# Patient Record
Sex: Male | Born: 1950
Health system: Southern US, Community
[De-identification: ages and names within clinical notes are randomized; demographics above are authoritative.]

## PROBLEM LIST (undated history)

## (undated) DIAGNOSIS — M25519 Pain in unspecified shoulder: Secondary | ICD-10-CM

## (undated) DIAGNOSIS — J449 Chronic obstructive pulmonary disease, unspecified: Secondary | ICD-10-CM

## (undated) DIAGNOSIS — I10 Essential (primary) hypertension: Secondary | ICD-10-CM

## (undated) DIAGNOSIS — E785 Hyperlipidemia, unspecified: Secondary | ICD-10-CM

## (undated) DIAGNOSIS — G8929 Other chronic pain: Secondary | ICD-10-CM

---

## 2003-08-18 ENCOUNTER — Ambulatory Visit (HOSPITAL_COMMUNITY): Admission: RE | Admit: 2003-08-18 | Discharge: 2003-08-18 | Payer: Self-pay | Admitting: Family Medicine

## 2003-11-02 ENCOUNTER — Ambulatory Visit (HOSPITAL_COMMUNITY): Admission: RE | Admit: 2003-11-02 | Discharge: 2003-11-02 | Payer: Self-pay | Admitting: Internal Medicine

## 2003-11-24 ENCOUNTER — Emergency Department (HOSPITAL_COMMUNITY): Admission: EM | Admit: 2003-11-24 | Discharge: 2003-11-24 | Payer: Self-pay | Admitting: *Deleted

## 2004-08-21 ENCOUNTER — Inpatient Hospital Stay (HOSPITAL_COMMUNITY): Admission: EM | Admit: 2004-08-21 | Discharge: 2004-08-23 | Payer: Self-pay | Admitting: Emergency Medicine

## 2004-08-22 ENCOUNTER — Ambulatory Visit: Payer: Self-pay | Admitting: *Deleted

## 2004-08-23 ENCOUNTER — Ambulatory Visit: Payer: Self-pay | Admitting: Cardiology

## 2005-10-22 ENCOUNTER — Emergency Department (HOSPITAL_COMMUNITY): Admission: EM | Admit: 2005-10-22 | Discharge: 2005-10-22 | Payer: Self-pay | Admitting: Emergency Medicine

## 2006-09-18 ENCOUNTER — Emergency Department (HOSPITAL_COMMUNITY): Admission: EM | Admit: 2006-09-18 | Discharge: 2006-09-19 | Payer: Self-pay | Admitting: Emergency Medicine

## 2006-09-21 ENCOUNTER — Ambulatory Visit (HOSPITAL_COMMUNITY): Admission: RE | Admit: 2006-09-21 | Discharge: 2006-09-21 | Payer: Self-pay | Admitting: Family Medicine

## 2006-09-24 ENCOUNTER — Ambulatory Visit (HOSPITAL_COMMUNITY): Admission: RE | Admit: 2006-09-24 | Discharge: 2006-09-24 | Payer: Self-pay | Admitting: Family Medicine

## 2008-11-12 ENCOUNTER — Ambulatory Visit: Payer: Self-pay | Admitting: Internal Medicine

## 2008-12-10 ENCOUNTER — Ambulatory Visit: Payer: Self-pay | Admitting: Internal Medicine

## 2008-12-10 ENCOUNTER — Encounter: Payer: Self-pay | Admitting: Internal Medicine

## 2008-12-10 ENCOUNTER — Ambulatory Visit (HOSPITAL_COMMUNITY): Admission: RE | Admit: 2008-12-10 | Discharge: 2008-12-10 | Payer: Self-pay | Admitting: Internal Medicine

## 2008-12-15 ENCOUNTER — Encounter: Payer: Self-pay | Admitting: Internal Medicine

## 2011-01-31 NOTE — Op Note (Signed)
NAMELANEY, BAGSHAW               ACCOUNT NO.:  [id number]   MEDICAL RECORD NO.:  [id number]          PATIENT TYPE:  AMB   LOCATION:  DAY                           FACILITY:  [facility]   PHYSICIAN:  R. Roetta Sessions, M.D. DATE OF BIRTH:  03/03/1951   DATE OF PROCEDURE:  12/10/2008  DATE OF DISCHARGE:                               OPERATIVE REPORT   INDICATIONS FOR PROCEDURE:  A 60 year old gentleman with a history of  colonic adenomas.  Last colonoscopy was 5 years ago.  He has no lower GI  tract symptoms.  Colonoscopy is now being done as surveillance maneuver.  Risks, benefits, alternatives and limitations have been reviewed,  questions answered.  Please see the documentation in the medical record.   PROCEDURE NOTE:  O2 saturation, blood pressure, pulse and respirations  monitored throughout the entire procedure.  Conscious sedation Versed 4  mg IV, Demerol 75 mg IV in divided doses.   INSTRUMENT:  Pentax video chip system.   FINDINGS:  Digital rectal exam revealed no abnormalities.   ENDOSCOPIC FINDINGS:  The prep was good.   Colon:  Colonic mucosa was surveyed from the rectosigmoid junction  through the left, transverse, right colon to the area of the appendiceal  orifice, ileocecal valve and cecum.  These structures were well seen and  photographed for the record.  From this level, the scope was slowly and  cautiously withdrawn.  All previously mentioned mucosal surfaces were  again seen.  The colonic mucosa appeared normal aside from a single  diminutive polyp at the hepatic flexure which was cold biopsied/removed.  Remainder of colonic mucosa appeared normal.  Scope was pulled down to  the rectum where thorough examination of the rectal mucosa including  retroflexion of the  anal verge demonstrated no abnormalities.  The  patient tolerated the procedure well and was reactive to endoscopy.  Cecal withdrawal time 8 minutes.   IMPRESSION:  1. Normal rectum.  2. Diminutive  hepatic flexure polyp, otherwise normal colon.   RECOMMENDATIONS:  1. Follow-up on pathology.  2. Further recommendations to follow.      Jonathon Bellows, M.D.  Electronically Signed     RMR/MEDQ  D:  12/10/2008  T:  12/10/2008  Job:  [id number]   cc:   Angus G. Renard Matter, MD  Fax: [phone]

## 2011-02-03 NOTE — Op Note (Signed)
NAME:  Greg Palmer, Greg Palmer                         ACCOUNT NO.:  [id number]   MEDICAL RECORD NO.:  [id number]                   PATIENT TYPE:  AMB   LOCATION:  DAY                                  FACILITY:  [facility]   PHYSICIAN:  R. Roetta Sessions, M.D.              DATE OF BIRTH:  [birth year]   DATE OF PROCEDURE:  11/02/2003  DATE OF DISCHARGE:                                 OPERATIVE REPORT   INDICATIONS FOR PROCEDURE:  The patient is a 60 year old gentleman who comes  for colorectal cancer screening.  He is followed primarily by Dr. Renard Matter.  He is devoid of any lower GI tract symptoms.  There is no family history of  colorectal neoplasia.  He has never had a colonoscopy.  Colonoscopy is now  being done as a standard screening maneuver.  This approach has been  discussed with the patient, along with the potential risks, benefits, and  alternatives.  Questions were answered.  He is agreeable.  Please see the  documentation in the medical record.   PROCEDURE NOTE:  O2 saturation, blood pressure, pulse, and respirations were  monitored throughout the entire procedure.   CONSCIOUS SEDATION:  Versed 3 mg IV, Demerol 75 mg IV in divided doses.   INSTRUMENT:  Olympus video chip adult colonoscope.   FINDINGS:  Digital rectal examination revealed no abnormalities.   ENDOSCOPIC FINDINGS:  Prep was adequate.   RECTUM:  Examination of the rectal mucosa including retroflexed view of the  anal verge revealed no abnormalities.   COLON:  The colonic mucosa was surveyed from the rectosigmoid junction  through the left transverse, right colon, in the area of the appendiceal  orifice, ileocecal valve, and cecum.  These structures were well-seen and  photographed for the record.  From this level, the scope was slowly  withdrawn.  All previously mentioned mucosal surfaces were again seen.  The  terminal ileum was intubated to 10 cm.  The terminal ileum and the colonic  mucosa appeared entirely normal  except for a 5 mm polyp on a stalk in the  middescending colon.  It was cold snared and recovered through the scope.  The patient tolerated the procedure well and was reacted in endoscopy.   IMPRESSION:  Normal rectum.  Small polyp on a stalk, middescending colon,  cold snared and removed.  The remainder of the colonic mucosa and terminal  ileum appeared normal.   RECOMMENDATIONS:  1. Followup on pathology.  2. Further recommendations to follow.      ___________________________________________                                            Jonathon Bellows, M.D.   RMR/MEDQ  D:  11/02/2003  T:  11/02/2003  Job:  [id number]   cc:   Angus  Edilia Bo, M.D.  [address]  [place]  [place] [address]  Fax: [phone]

## 2011-02-03 NOTE — Group Therapy Note (Signed)
NAMETAEVIN, MCFERRAN               ACCOUNT NO.:  [id number]   MEDICAL RECORD NO.:  [id number]          PATIENT TYPE:  INP   LOCATION:  A203                          FACILITY:  [facility]   PHYSICIAN:  Angus G. Renard Matter, MD   DATE OF BIRTH:  [birth year]   DATE OF PROCEDURE:  DATE OF DISCHARGE:                                   PROGRESS NOTE   This patient was admitted to the hospital with anterior chest pain to rule  out a coronary syndrome.  This was felt to be atypical chest pain by a  cardiologist.  His cardiac enzymes remain normal.  He is scheduled for a  Cardiolite study.  Echocardiogram has been completed.   OBJECTIVE:  VITAL SIGNS:  Blood pressure 133/86, respirations 20, pulse 89,  temp 97.8.  LUNGS:  Clear to palpation and auscultation.  HEART:  Regular rhythm.   ASSESSMENT:  Patient was admitted with anterior chest pain of undetermined  etiology to rule out coronary syndrome.   PLAN:  To proceed with Cardiolite stress test.     Angu   AGM/MEDQ  D:  08/23/2004  T:  08/23/2004  Job:  [id number]

## 2011-02-03 NOTE — Discharge Summary (Signed)
NAMEMATIN, MATTIOLI               ACCOUNT NO.:  [id number]   MEDICAL RECORD NO.:  [id number]          PATIENT TYPE:  INP   LOCATION:  A203                          FACILITY:  [facility]   PHYSICIAN:  Angus G. Renard Matter, MD   DATE OF BIRTH:  14-Sep-1951   DATE OF ADMISSION:  08/21/2004  DATE OF DISCHARGE:  12/06/2005LH                                 DISCHARGE SUMMARY   DATES:  A 60 year old white Palmer admitted August 21, 2004, discharged  August 23, 2004, two days hospitalization.   DIAGNOSES:  1.  Respiratory infection.  2.  Bronchitis.  3.  Pleurisy.  4.  Dyslipidemia.  5.  Chest pain, anterior.   CONDITION:  Stable and improved at the time of discharge.   HISTORY OF PRESENT ILLNESS:  This Greg Palmer presented to the  emergency department with the chief complaint being intermittent anterior  chest pain which began on the day of admission.  He apparently had an  episode of vomiting as well, and experienced tightness over the anterior  chest with shortness of breath.  He was seen and evaluated by the emergency  department physician at [facility].  An electrocardiogram  was interpreted as possible old anterior septal infarct, rate of 82.   PHYSICAL EXAMINATION:  An alert Palmer.  Vital signs:  Blood pressure 112/63,  respirations 20, pulse 79.  HEENT:  Eyes, PERRLA, TM's negative.  Oropharynx  benign.  Neck supple.  No JVD or thyroid abnormalities.  Lungs clear to P&A.  Heart, regular rhythm, no murmurs, no cardiomegaly. Abdomen, no palpable  organs or masses.  Extremities free of edema.  Neurologic, no focal  deficits.   LABORATORY DATA:  Admission CBC:  WBC 7500, hemoglobin 14.1, hematocrit  40.3.  D-Dimer 0.24.  Chemistries, sodium 135, potassium 3.4, chloride 101.  CO2 28.  Glucose 131.  BUN 14, creatinine 1.  Calcium 8.3.  Total protein  6.6.  Albumin 3.6.  Liver enzymes:  SGOT 24, SGPT 32.  Alkaline phosphatase  72.  Bilirubin 0.6.  CPK on  admission, 170.  CPK-MB 1.9.  Relative index  1.1.  Troponin less than 0.01.  Subsequent CPK 132, 125.  CPK-MB 1.4, 101.  Relative index 1.1 and 0.9.  Troponin less than 0.01.  Lipid profile,  cholesterol 242, triglycerides 450.  HDL 29.  Cardiac markers, myoglobin  92.9, 92.8, 90.8.   Chest x-ray, no acute findings.  Echocardiogram normal ventricular chamber  size, mild hypertrophy with disproportionate septal thickening.  Electrocardiogram:  Questionable old anteroseptal infarct.   HOSPITAL COURSE:  The patient at the time of his admission, was placed on  half normal saline at [facility] rate, low-cholesterol diet, vital signs q.i.d.,  bed rest.  The patient had CPK, CPK-MB, troponin q.8h. x3.  He was given  nitroglycerin 0.4 mg sublingual p.r.n. for chest pain, aspirin 325 mg daily.  He was given Hycodan syrup one teaspoon q.4h. p.r.n. for cough.  He was seen  in cardiology.  He was made n.p.o. for exercise stress test.  Echocardiogram  for left ventricular ejection fraction.  Echocardiogram showed  mild  ventricular hypertrophy.  Cardiolite stress test results not on record at  the time of this dictation.  It was felt that the patient had atypical chest  pain, but no evidence of coronary disease, although it was considered at the  time of admission.   FOLLOWUP:  The patient improved during his hospital stay, had minimal chest  pain and was able to be discharged after two days hospitalization to be  followed as an outpatient, to be seen again by cardiology as well.     Angu   AGM/MEDQ  D:  09/06/2004  T:  09/06/2004  Job:  [id number]

## 2011-02-03 NOTE — Procedures (Signed)
[name], Greg Palmer               ACCOUNT NO.:  [id number]   MEDICAL RECORD NO.:  [id number]          PATIENT TYPE:  INP   LOCATION:  A203                          FACILITY:  [facility]   PHYSICIAN:  Lake Roberts Bing, M.D.  DATE OF BIRTH:  [birth year]   DATE OF PROCEDURE:  08/22/2004  DATE OF DISCHARGE:                                  ECHOCARDIOGRAM   REFERRING PHYSICIANS:  1.  Dr. Renard Matter.  2.  Dr. Dorethea Clan.   CLINICAL DATA:  This is a 60 year old gentleman with chest pain and dyspnea.   M-MODE:  1.  Aorta 3.2.  2.  Left atrium 3.7.  3.  Septum 1.7.  4.  Posterior wall 1.5.  5.  LV diastole 4.1.  6.  LV systole 3.5.   1.  Technically adequate echocardiographic study.  2.  Normal left atrium, right atrium and right ventricle.  3.  Mild aortic valvular sclerosis with normal function.  4.  Normal mitral valve; mild annular calcification; trivial regurgitation.  5.  Normal tricuspid and pulmonic valve; normal proximal pulmonary artery.  6.  Normal left ventricular chamber size; mild hypertrophy with      disproportionate septal thickening.  Normal regional and global LV      systolic function.  7.  Normal IVC.     Robe   RR/MEDQ  D:  08/22/2004  T:  08/23/2004  Job:  [id number]

## 2011-02-03 NOTE — H&P (Signed)
Greg Palmer, Greg Palmer               ACCOUNT NO.:  [id number]   MEDICAL RECORD NO.:  [id number]          PATIENT TYPE:  AMB   LOCATION:  DAY                           FACILITY:  [facility]   PHYSICIAN:  R. Roetta Sessions, M.D. DATE OF BIRTH:  06/07/1951   DATE OF ADMISSION:  11/12/2008  DATE OF DISCHARGE:  LH                              HISTORY & PHYSICAL   PRIMARY CARE PHYSICIAN:  Angus G. Renard Matter, MD.   CHIEF COMPLAINT:  Due for surveillance colonoscopy.   HISTORY OF PRESENT ILLNESS:  Greg Palmer is a 60 year old Caucasian male.  He presents today for a 5 year follow up colonoscopy.  He was found to  have an inflamed focally adenomatous polyp removed from his mid  descending colon, he had an otherwise normal exam.  He denies any GI  problems at this time.  Specifically, he denies any abdominal pain,  rectal bleeding, melena, change in bowel habits or weight loss.  His  appetite has been good.  He denies any fatigue.   PAST MEDICAL/SURGICAL HISTORY:  Last colonoscopy November 02, 2003 by  Dr. Jena Gauss.  Small inflamed focally adenomatous polyp removed from his  mid ascending colon.   CURRENT MEDICATIONS:  1. Multivitamin daily.  2. Vitamin C daily.  3. Flax seed daily.  4. Fish oil daily.  5. Burn Fat over-the-counter daily.   ALLERGIES:  NO KNOWN DRUG ALLERGIES.   FAMILY HISTORY:  There is no known family history of colon carcinoma,  liver or chronic GI problems.  Mother deceased at 33 secondary to  suicide.  Father deceased at 68 secondary to motor vehicle accident.  He  has one healthy brother.   SOCIAL HISTORY:  Greg Palmer is married.  He has 2 children.  He is  retired from [employer].  He has a remote history of tobacco abuse.  He consumes a couple of alcoholic beverages per year.  He has a past  history of marijuana use.  No current drug use.   REVIEW OF SYSTEMS:  See HPI, otherwise negative.   PHYSICAL EXAMINATION:  VITAL SIGNS:  Weight 262 pounds, height 71  inches,  temperature 98.4, blood pressure 132/88 and pulse 60.  GENERAL:  He is an obese Caucasian male who is alert, oriented,  pleasant, cooperative in no acute distress.  HEENT:  Sclerae are clear.  Conjunctivae are clear.  Oropharynx is pink  and moist without any lesions.  NECK:  Supple without mass, thyromegaly.  HEART:  Regular rate and rhythm.  Normal S1-S2 without any murmurs,  clicks, rubs or gallops.  LUNGS:  Clear to auscultation bilaterally.  ABDOMEN:  Protuberant with positive bowel sounds x4.  No bruits  auscultated.  Soft, nontender and nondistended without palpable mass,  hepatosplenomegaly, rebound or guarding.  EXTREMITIES:  Without clubbing or edema.   IMPRESSION:  Greg Palmer is a 60 year old Caucasian male with history of  adenomatous colon polyp.  He is here to setup surveillance colonoscopy.  He denies any GI problems at this time.   PLAN:  1. He will undergo surveillance colonoscopy with Dr. Jena Gauss in the  near      future.  I have discussed the procedure including risks and      benefits which include but are not limited to GI bleeding,      infection, perforation, drug reaction, increased pain and consent      will be obtained.  2. Pending this colonoscopy, he may be able to fall into a 10 year      surveillance colonoscopy mode if no polyps are found on this exam      given the new guidelines.      Lorenza Burton, N.P.      Greg Palmer, M.D.  Electronically Signed    KJ/MEDQ  D:  11/13/2008  T:  11/13/2008  Job:  [id number]   cc:   Angus G. Renard Matter, MD  Fax: [phone]

## 2011-02-03 NOTE — H&P (Signed)
Greg Palmer, Greg Palmer               ACCOUNT NO.:  [id number]   MEDICAL RECORD NO.:  [id number]          PATIENT TYPE:  INP   LOCATION:  A203                          FACILITY:  [facility]   PHYSICIAN:  Angus G. Renard Matter, MD   DATE OF BIRTH:  06/11/1951   DATE OF ADMISSION:  08/21/2004  DATE OF DISCHARGE:  LH                                HISTORY & PHYSICAL   This 60 year old white male presented himself to the ED with chief complaint  being intermittent anterior chest pain which began yesterday.  He apparently  had an episode of vomiting as well and has experienced tightness over the  anterior chest with some shortness of breath.  He was seen and evaluated by  ED physician at [facility].  Electrocardiogram was  interpreted as possible old anteroseptal infarct with a rate of 82.   LABORATORY AND X-RAY DATA:  CBC:  WBC 7500, hemoglobin 14.1, hematocrit  40.3.  Chemistries:  Sodium 135, potassium 3.4, chloride 101, CO2 28,  glucose 131, BUN 14, creatinine 1, calcium 8.3.  Total protein 6.6, albumin  3.6.  Cardiac markers:  Myoglobin 92.9, CPK-MB 2, troponin less than 0.05.  Subsequent cardiac markers:  Myoglobin 92.8, CPK-MB 2.1, troponin less than  0.05.   The patient was admitted for further evaluation.   SOCIAL HISTORY:  The patient does not smoke or drink alcohol, works at  [employer].   FAMILY HISTORY:  See previous record.   PAST MEDICAL AND SURGICAL HISTORY:  1.  History of previous pneumonia.  2.  History of asthma.  3.  Hyperlipidemia.   ALLERGIES:  None.   MEDICATIONS:  The patient takes no medications.   PHYSICAL EXAMINATION:  GENERAL:  Alert male.  VITAL SIGNS:  Blood pressure 112/63, respirations 20, pulse 79.  HEENT:  Eyes:  PERRLA.  TMs negative.  Oropharynx benign.  NECK:  Supple.  No JVD or thyroid abnormalities.  LUNGS:  Clear to percussion and auscultation.  HEART:  Regular rate and rhythm.  No murmurs, no cardiomegaly.  ABDOMEN:  No  palpable organs or masses.  SKIN:  Warm and dry.  EXTREMITIES:  Free of edema.  NEUROLOGIC:  No focal deficits.   DIAGNOSES:  1.  Anterior chest pain.  2.  Bronchitis.  3.  Rule out underlying coronary artery disease.  4.  Hyperlipidemia.     Angu   AGM/MEDQ  D:  08/21/2004  T:  08/21/2004  Job:  [id number]

## 2011-02-03 NOTE — Group Therapy Note (Signed)
Greg Palmer, Greg Palmer               ACCOUNT NO.:  [id number]   MEDICAL RECORD NO.:  [id number]          PATIENT TYPE:  INP   LOCATION:  A203                          FACILITY:  [facility]   PHYSICIAN:  Angus G. Renard Matter, MD   DATE OF BIRTH:  [birth year]   DATE OF PROCEDURE:  DATE OF DISCHARGE:                                   PROGRESS NOTE   This patient was admitted through the ED with anterior chest pain.  He has  had cough, nausea, pain with deep breathing.  His cardiac enzymes on  admission were normal.   OBJECTIVE:  VITAL SIGNS:  Blood pressure 104/81, respirations 20, pulse 97,  temp 96.3.  LUNGS:  Occasional rhonchus over lower lung field.  HEART:  Regular rate and rhythm.  ABDOMEN:  No palpable organs or masses.   Patient's cardiac enzymes remained normal.  CPK 132, CPK MB 1.4, relative  index 1.1, troponin less than 0.01.   ASSESSMENT:  Patient was admitted to the hospital with anterior chest pain  to rule out acute coronary syndrome.  He has had a cough and chest  congestion and pain with cough.  Does have upper respiratory infection,  bronchitis, possible pleurisy.   PLAN:  To continue current regimen.  Will proceed with a cardiology  evaluation.     Angu   AGM/MEDQ  D:  08/22/2004  T:  08/22/2004  Job:  [id number]

## 2011-02-03 NOTE — Consult Note (Signed)
NAMELYON, DUMONT               ACCOUNT NO.:  [id number]   MEDICAL RECORD NO.:  [id number]          PATIENT TYPE:  INP   LOCATION:  A203                          FACILITY:  [facility]   PHYSICIAN:  Vida Roller, M.D.   DATE OF BIRTH:  21-Oct-1950   DATE OF CONSULTATION:  08/22/2004  DATE OF DISCHARGE:                                   CONSULTATION   PRIMARY CARE PHYSICIAN:  Angus G. Renard Matter, M.D.   HISTORY OF PRESENT ILLNESS:  Mr. Brookens is a 60 year old gentleman with past  medical history significant for dyslipidemia who reports onset of anterior  chest pressure starting yesterday morning after coughing and vomiting spell.  He reports a constant anterior chest pressure which worsened with deep  inspiration. He also reports associated shortness of breath. This episode  occurred after he had been hunting yesterday morning. He denies any previous  episodes of chest discomfort. He has had a productive cough for the last  several weeks, however. He came to the emergency department for further  evaluation. There, he was given 1 sublingual nitroglycerin which resolved  his chest pressure; however, he still complains of pain with deep  inspiration. His breathing is improved.   PAST MEDICAL HISTORY:  1.  History of pneumonia in 2001.  2.  Asthma as a child.  3.  Dyslipidemia 1 year ago diagnosed with no medical therapy initiated.  4.  Colonoscopy in February of 2005 status post polypectomy.  5.  Stress test approximately five years ago that is negative per the      patient's report.   ALLERGIES:  No known drug allergies.   MEDICATIONS PRIOR TO ADMISSION:  Multivitamin daily.   Medications in the hospital:  1.  Enteric-coated aspirin 325 mg daily.  2.  IV half normal saline at [facility].  3.  Nitroglycerin as needed.  4.  Hydrocodone syrup as needed.  5.  Phenergan as needed.  6.  Ambien as needed.  7.  Flu vaccine and pneumonia vaccine x1.   SOCIAL HISTORY:  Mr. Harmon is married. He  lives in [place] with his wife.  He works as a Estate agent at [employer]. He has 8 children and 7  grandchildren. He has a history of tobacco abuse; however, he quit 30 years  ago. He does drink about a beer per day. No illicit drug use. Exercise:  He  is active with his hobbies including hunting and fishing. He does not follow  a low fat, low cholesterol diet.   FAMILY HISTORY:  Mother deceased at 47 years old secondary to suicide.  Father deceased at 32 years old with history of heart trouble, CVA, and  diabetes. He has one brother who is living with no known coronary artery  disease.   REVIEW OF SYSTEMS:  Negative for fevers or chills. Negative for congestion  or headaches. Negative for rashes or lesions. CARDIOVASCULAR:  Per HPI. He  does state that he has had a syncopal episode after coughing where he was  out for about a second. GENITOURINARY:  No frequency or dysuria. NEUROPSYCH:  No weakness or  numbness. MUSCULOSKELETAL:  No myalgias or arthralgias.  GASTROINTESTINAL:  Nausea and vomiting x1 episode. No diarrhea,  constipation, bright red blood per rectum, melena, abdominal pain, or GERD  symptoms. All other systems reviewed are negative.   PHYSICAL EXAMINATION:  VITAL SIGNS:  Temperature 96.3, pulse 97,  respirations 20, blood pressure 104/81. Weight 235 pounds.  GENERAL:  This is a well-developed, well-nourished male in no acute  distress.  HEENT:  Normocephalic, atraumatic. Pupils are equal, round, and reactive to  light.  NECK:  Reveals no jugular venous distention and no carotid bruits.  CARDIOVASCULAR:  S1 and S2 normal with no murmurs, rubs, or gallops  appreciated.  LUNGS:  Clear to auscultation bilaterally without wheezes, rales, or  rhonchi.  SKIN:  Warm and dry with no rashes or lesions noted.  ABDOMEN:  Soft, nontender, with active bowel sounds.  GENITOURINARY/RECTAL:  Deferred.  EXTREMITIES:  Revealed no clubbing, cyanosis, or edema, and distal pulses   are intact and equal in all 4 extremities.  MUSCULOSKELETAL:  No joint deformity or effusions are noted.  NEUROLOGICAL:  He is alert and oriented x3. Grossly nonfocal exam.   STUDIES:  Chest x-ray:  No acute findings. Electrocardiogram:  Sinus rhythm  at 82 beats per minute with left axis deviation, nonspecific ST  abnormalities, normal PR interval, QRS duration and QTC.   LABORATORY DATA:  White blood cell count 7.5, hemoglobin 14.1, hematocrit  40.3, platelets 217. Sodium 135, potassium 3.4, chloride 101, bicarb 28, BUN  14, creatinine 1.0, glucose 131. Total bilirubin 0.6, alkaline phosphatase  72, AST 24, ALT 32, total protein 6.6, albumin 3.6, calcium 8.3. Cardiac  enzymes were negative x2 for acute myocardial infarction.   IMPRESSION AND PLAN:  1.  Atypical chest discomfort in a patient with cardiac risk factors      including male sex and dyslipidemia. He has had 2 sets of cardiac      enzymes that are negative for acute myocardial infarction, and he has no      ischemic changes noted on EKG. For further evaluation, will check a D-      dimer. If this is elevated, would check a chest CT for rule out      pulmonary embolism. Will also obtain an echocardiogram today and an      exercise Cardiolite tomorrow if his pulmonary embolism workup is      negative. Will obtain TSH level.  2.  Dyslipidemia. Will check fasting lipid profile and treat according to      the results.   We appreciate this consult and will be happy to follow this patient along  with you.     Amy   AB/MEDQ  D:  08/22/2004  T:  08/22/2004  Job:  [id number]

## 2011-08-08 ENCOUNTER — Encounter: Payer: Self-pay | Admitting: Emergency Medicine

## 2011-08-08 ENCOUNTER — Emergency Department (HOSPITAL_COMMUNITY)
Admission: EM | Admit: 2011-08-08 | Discharge: 2011-08-08 | Disposition: A | Discharge status: Home / Self Care | Payer: BC Managed Care – PPO | Attending: Emergency Medicine | Admitting: Emergency Medicine

## 2011-08-08 DIAGNOSIS — W57XXXA Bitten or stung by nonvenomous insect and other nonvenomous arthropods, initial encounter: Secondary | ICD-10-CM | POA: Insufficient documentation

## 2011-08-08 DIAGNOSIS — S30860A Insect bite (nonvenomous) of lower back and pelvis, initial encounter: Secondary | ICD-10-CM | POA: Insufficient documentation

## 2011-08-08 MED ORDER — LIDOCAINE HCL (PF) 1 % IJ SOLN
5.0000 mL | Freq: Once | INTRAMUSCULAR | Status: AC
  Administered 2011-08-08: 5 mL
  Filled 2011-08-08: qty 5

## 2011-08-08 MED ORDER — DOXYCYCLINE HYCLATE 100 MG PO CAPS: 100.0000 mg | ORAL_CAPSULE | Freq: Two times a day (BID) | ORAL | Status: AC

## 2011-08-08 NOTE — ED Notes (Signed)
Pt c/o tick head imbedded in his left upper quad

## 2011-08-08 NOTE — ED Notes (Signed)
Small red area just below breast, states unable to get head of tick out.

## 2011-08-09 NOTE — ED Provider Notes (Signed)
History     CSN: [id number] Arrival date & time: 08/08/2011 11:32 AM   First MD Initiated Contact with Patient 08/08/11 1154      Chief Complaint  Patient presents with  . Tick Removal    (Consider location/radiation/quality/duration/timing/severity/associated sxs/prior treatment) Patient is a 60 y.o. male presenting with foreign body. The history is provided by the patient and the spouse.  Foreign Body  Episode onset: He does believe the tick could have been embedded for more than 2 days. Intake: Patient removed a tick from his abdomen which was dead and dried up.  Wife reports it fell apart as it was being pulled,  leaving the head still in his skin. Pertinent negatives include no chest pain, no fever, no abdominal pain, no congestion and no sore throat. Associated symptoms comments: Denies rash,  Fever and myalgias. .    History reviewed. No pertinent past medical history.  History reviewed. No pertinent past surgical history.  History reviewed. No pertinent family history.  History  Substance Use Topics  . Smoking status: Not on file  . Smokeless tobacco: Not on file  . Alcohol Use: Yes      Review of Systems  Constitutional: Negative for fever.  HENT: Negative for congestion, sore throat and neck pain.   Eyes: Negative.   Respiratory: Negative for chest tightness and shortness of breath.   Cardiovascular: Negative for chest pain.  Gastrointestinal: Negative for nausea and abdominal pain.  Genitourinary: Negative.   Musculoskeletal: Negative for joint swelling and arthralgias.  Skin: Negative for rash and wound.  Neurological: Negative for dizziness, weakness, light-headedness, numbness and headaches.  Hematological: Negative.   Psychiatric/Behavioral: Negative.     Allergies  Review of patient's allergies indicates no known allergies.  Home Medications   Current Outpatient Rx  Name Route Sig Dispense Refill  . FLAX SEED OIL 1300 MG PO CAPS Oral Take 1  tablet by mouth 2 (two) times daily.      . MULTIVITAMIN PO Oral Take 0.5 tablets by mouth 2 (two) times daily.      Marland Kitchen NAPROXEN SODIUM 220 MG PO TABS Oral Take 220 mg by mouth 2 (two) times daily. Pain     . FISH OIL 1000 MG PO CAPS Oral Take 1 capsule by mouth 2 (two) times daily.      Marland Kitchen DOXYCYCLINE HYCLATE 100 MG PO CAPS Oral Take 1 capsule (100 mg total) by mouth 2 (two) times daily. 20 capsule 0    BP 154/99  Pulse 89  Temp 99.2 F (37.3 C)  Resp 18  Ht 5\' 11"  (1.803 m)  Wt 240 lb (108.863 kg)  BMI 33.47 kg/m2  SpO2 94%  Physical Exam  Nursing note and vitals reviewed. Constitutional: He is oriented to person, place, and time. He appears well-developed and well-nourished.  HENT:  Head: Normocephalic and atraumatic.  Eyes: Conjunctivae are normal.  Neck: Normal range of motion.  Cardiovascular: Normal rate, regular rhythm, normal heart sounds and intact distal pulses.   Pulmonary/Chest: Effort normal and breath sounds normal. He has no wheezes.  Abdominal: Soft. Bowel sounds are normal. There is no tenderness.  Musculoskeletal: Normal range of motion.  Neurological: He is alert and oriented to person, place, and time.  Skin: Skin is warm and dry.       Less than 1 cm area of erythema left upper abdomen,  Slightly raised,  Central dark foreign body.   Psychiatric: He has a normal mood and affect.    ED  Course  FOREIGN BODY REMOVAL Performed by: Leven Hoel L Authorized by: Burgess Amor L Consent: Verbal consent obtained. Risks and benefits: risks, benefits and alternatives were discussed Consent given by: patient Patient understanding: patient states understanding of the procedure being performed Time out: Immediately prior to procedure a "time out" was called to verify the correct patient, procedure, equipment, support staff and site/side marked as required. Body area: skin Anesthesia: local infiltration Local anesthetic: lidocaine 1% without epinephrine Localization  method: visualized Removal mechanism: forceps Dressing: dressing applied Tendon involvement: none Complexity: simple 1 objects recovered. Objects recovered: tiny thorn shaped object Post-procedure assessment: foreign body removed Patient tolerance: Patient tolerated the procedure well with no immediate complications.   (including critical care time)  Labs Reviewed - No data to display No results found.   1. Tick bite       MDM  Foreign body removal.  Prophylaxed pt with doxycycline.        Candis Musa, PA 08/09/11 [phone]

## 2011-08-09 NOTE — ED Provider Notes (Signed)
Medical screening examination/treatment/procedure(s) were performed by non-physician practitioner and as supervising physician I was immediately available for consultation/collaboration.  Makenah Karas, MD 08/09/11 0709 

## 2012-04-25 ENCOUNTER — Emergency Department (HOSPITAL_COMMUNITY)
Admission: EM | Admit: 2012-04-25 | Discharge: 2012-04-26 | Disposition: A | Discharge status: Home / Self Care | Payer: BC Managed Care – PPO | Attending: Emergency Medicine | Admitting: Emergency Medicine

## 2012-04-25 ENCOUNTER — Encounter (HOSPITAL_COMMUNITY): Payer: Self-pay | Admitting: *Deleted

## 2012-04-25 DIAGNOSIS — M549 Dorsalgia, unspecified: Secondary | ICD-10-CM | POA: Insufficient documentation

## 2012-04-25 LAB — URINALYSIS, ROUTINE W REFLEX MICROSCOPIC
Bilirubin Urine: NEGATIVE
Glucose, UA: NEGATIVE mg/dL
Hgb urine dipstick: NEGATIVE
Ketones, ur: NEGATIVE mg/dL
Leukocytes, UA: NEGATIVE
Nitrite: NEGATIVE
Protein, ur: NEGATIVE mg/dL
Specific Gravity, Urine: >1.03 — ABNORMAL HIGH (ref 1.005–1.030)
Urobilinogen, UA: 0.2 mg/dL (ref 0.0–1.0)
pH: 5.5 (ref 5.0–8.0)

## 2012-04-25 NOTE — ED Notes (Signed)
Pt reports left flank pain that started this morning. No Hx no kidney stones. Pt states no problems w/ urination.

## 2012-04-25 NOTE — ED Notes (Signed)
Family at bedside. 

## 2012-04-25 NOTE — ED Notes (Signed)
Patient states he is in pain at this time. RN notified.

## 2012-04-25 NOTE — ED Provider Notes (Signed)
History  This chart was scribed for Raeford Razor, MD by Bennett Scrape. This patient was seen in room [facility]10/[facility]10 and the patient's care was started at 11:23PM.  CSN: [id number]  Arrival date & time 04/25/12  2030   First MD Initiated Contact with Patient 04/25/12 2323      Chief Complaint  Patient presents with  . Flank Pain    The history is provided by the patient. No language interpreter was used.    Greg Palmer is a 61 y.o. male who presents to the Emergency Department complaining of 19 hours of gradual onset, gradually worsening, constant left flank pain that he woke up with. The pain is worse with all movement. He denies nausea, urinary symptoms, He denies having prior back surgery, no numb, weak, no h/o IV drug use, no blood thinners, no prior episodes, took one Aleve with no improvement in symptoms. Pt does not have a h/o chronic medical conditions. He is an occasional alcohol user but denies smoking.  History reviewed. No pertinent past medical history.  History reviewed. No pertinent past surgical history.  History reviewed. No pertinent family history.  History  Substance Use Topics  . Smoking status: Never Smoker   . Smokeless tobacco: Not on file  . Alcohol Use: Yes      Review of Systems  Constitutional: Negative for fever and chills.  Gastrointestinal: Negative for nausea, vomiting, abdominal pain and diarrhea.  Genitourinary: Positive for flank pain. Negative for dysuria and hematuria.  All other systems reviewed and are negative.    Allergies  Review of patient's allergies indicates no known allergies.  Home Medications   Current Outpatient Rx  Name Route Sig Dispense Refill  . FLAX SEED OIL 1300 MG PO CAPS Oral Take 1 tablet by mouth 2 (two) times daily.      . MULTIVITAMIN PO Oral Take 0.5 tablets by mouth 2 (two) times daily.      Marland Kitchen NAPROXEN SODIUM 220 MG PO TABS Oral Take 220 mg by mouth 2 (two) times daily. Pain     . FISH OIL 1000 MG PO  CAPS Oral Take 1 capsule by mouth 2 (two) times daily.        Triage Vitals: BP 147/86  Pulse 88  Temp 98.6 F (37 C) (Oral)  Resp 20  Ht 5\' 11"  (1.803 m)  Wt 245 lb (111.131 kg)  BMI 34.17 kg/m2  SpO2 100%  Physical Exam  Nursing note and vitals reviewed. Constitutional: He is oriented to person, place, and time. He appears well-developed and well-nourished. No distress.  HENT:  Head: Normocephalic and atraumatic.  Eyes: EOM are normal.  Neck: Neck supple. No tracheal deviation present.  Cardiovascular: Normal rate.   Pulmonary/Chest: Effort normal. No respiratory distress.  Abdominal:       Left CVA tenderness, no overlaying skin change sin area of pain  Musculoskeletal: Normal range of motion.  Neurological: He is alert and oriented to person, place, and time.       Sensation is intact, strength 5/5   Skin: Skin is warm and dry.  Psychiatric: He has a normal mood and affect. His behavior is normal.    ED Course  Procedures (including critical care time)  DIAGNOSTIC STUDIES: Oxygen Saturation is 100% on room air, normal by my interpretation.    COORDINATION OF CARE: 11:43PM-Discussed treatment plan with pt at bedside and pt agreed to plan.  Labs Reviewed  URINALYSIS, ROUTINE W REFLEX MICROSCOPIC - Abnormal; Notable for the following:  Specific Gravity, Urine >1.030 (*)     All other components within normal limits  LAB REPORT - SCANNED   No results found.   1. Back pain       MDM    I personally preformed the services scribed in my presence. The recorded information has been reviewed and considered. Raeford Razor, MD.        Raeford Razor, MD 05/07/12 1350

## 2012-04-25 NOTE — ED Notes (Signed)
Pain lt flank,onset this am,

## 2012-04-26 MED ORDER — SODIUM CHLORIDE 0.9 % IV BOLUS (SEPSIS)
1000.0000 mL | Freq: Once | INTRAVENOUS | Status: AC
  Administered 2012-04-26: 1000 mL via INTRAVENOUS

## 2012-04-26 MED ORDER — HYDROMORPHONE HCL PF 1 MG/ML IJ SOLN
1.0000 mg | Freq: Once | INTRAMUSCULAR | Status: AC
  Administered 2012-04-26: 1 mg via INTRAVENOUS
  Filled 2012-04-26: qty 1

## 2012-04-26 MED ORDER — ONDANSETRON HCL 4 MG/2ML IJ SOLN
4.0000 mg | Freq: Once | INTRAMUSCULAR | Status: AC
  Administered 2012-04-26: 4 mg via INTRAVENOUS
  Filled 2012-04-26: qty 2

## 2012-04-26 MED ORDER — KETOROLAC TROMETHAMINE 30 MG/ML IJ SOLN
15.0000 mg | Freq: Once | INTRAMUSCULAR | Status: AC
  Administered 2012-04-26: 15 mg via INTRAVENOUS
  Filled 2012-04-26: qty 1

## 2012-04-26 MED ORDER — DIAZEPAM 5 MG PO TABS: 5.0000 mg | ORAL_TABLET | Freq: Three times a day (TID) | ORAL | Status: AC | PRN

## 2012-04-26 MED ORDER — OXYCODONE-ACETAMINOPHEN 5-325 MG PO TABS: 1.0000 | ORAL_TABLET | ORAL | Status: AC | PRN

## 2012-04-26 MED ORDER — NAPROXEN 375 MG PO TABS: 375.0000 mg | ORAL_TABLET | Freq: Two times a day (BID) | ORAL | Status: DC | PRN

## 2012-04-26 NOTE — ED Notes (Signed)
Pt alert & oriented x4, stable gait. Patient  given discharge instructions, paperwork & prescription(s). Patient verbalized understanding. Pt left department w/ no further questions. 

## 2012-05-14 ENCOUNTER — Encounter (HOSPITAL_COMMUNITY): Payer: Self-pay | Admitting: Emergency Medicine

## 2012-05-14 ENCOUNTER — Emergency Department (HOSPITAL_COMMUNITY)
Admission: EM | Admit: 2012-05-14 | Discharge: 2012-05-15 | Disposition: A | Discharge status: Home / Self Care | Payer: BC Managed Care – PPO | Attending: Emergency Medicine | Admitting: Emergency Medicine

## 2012-05-14 DIAGNOSIS — R51 Headache: Secondary | ICD-10-CM | POA: Insufficient documentation

## 2012-05-14 DIAGNOSIS — R509 Fever, unspecified: Secondary | ICD-10-CM | POA: Insufficient documentation

## 2012-05-14 DIAGNOSIS — J9819 Other pulmonary collapse: Secondary | ICD-10-CM | POA: Insufficient documentation

## 2012-05-14 DIAGNOSIS — R110 Nausea: Secondary | ICD-10-CM

## 2012-05-14 DIAGNOSIS — R112 Nausea with vomiting, unspecified: Secondary | ICD-10-CM | POA: Insufficient documentation

## 2012-05-14 DIAGNOSIS — D696 Thrombocytopenia, unspecified: Secondary | ICD-10-CM | POA: Insufficient documentation

## 2012-05-14 DIAGNOSIS — J9811 Atelectasis: Secondary | ICD-10-CM

## 2012-05-14 DIAGNOSIS — E871 Hypo-osmolality and hyponatremia: Secondary | ICD-10-CM | POA: Insufficient documentation

## 2012-05-14 LAB — CBC WITH DIFFERENTIAL/PLATELET
Basophils Absolute: 0.1 10*3/uL (ref 0.0–0.1)
Basophils Relative: 1 % (ref 0–1)
Eosinophils Absolute: 0 10*3/uL (ref 0.0–0.7)
Eosinophils Relative: 0 % (ref 0–5)
HCT: 43.9 % (ref 39.0–52.0)
Hemoglobin: 15.5 g/dL (ref 13.0–17.0)
Lymphocytes Relative: 22 % (ref 12–46)
Lymphs Abs: 1.5 10*3/uL (ref 0.7–4.0)
MCH: 31.4 pg (ref 26.0–34.0)
MCHC: 35.3 g/dL (ref 30.0–36.0)
MCV: 89 fL (ref 78.0–100.0)
Monocytes Absolute: 0.8 10*3/uL (ref 0.1–1.0)
Monocytes Relative: 11 % (ref 3–12)
Neutro Abs: 4.5 10*3/uL (ref 1.7–7.7)
Neutrophils Relative %: 66 % (ref 43–77)
Platelets: 119 10*3/uL — ABNORMAL LOW (ref 150–400)
RBC: 4.93 MIL/uL (ref 4.22–5.81)
RDW: 12.9 % (ref 11.5–15.5)
WBC: 6.8 10*3/uL (ref 4.0–10.5)

## 2012-05-14 LAB — COMPREHENSIVE METABOLIC PANEL
ALT: 34 U/L (ref 0–53)
AST: 32 U/L (ref 0–37)
Albumin: 3.8 g/dL (ref 3.5–5.2)
Alkaline Phosphatase: 83 U/L (ref 39–117)
BUN: 14 mg/dL (ref 6–23)
CO2: 25 mEq/L (ref 19–32)
Calcium: 9.5 mg/dL (ref 8.4–10.5)
Chloride: 95 mEq/L — ABNORMAL LOW (ref 96–112)
Creatinine, Ser: 1.02 mg/dL (ref 0.50–1.35)
GFR calc Af Amer: 90 mL/min — ABNORMAL LOW (ref >90)
GFR calc non Af Amer: 77 mL/min — ABNORMAL LOW (ref >90)
Glucose, Bld: 140 mg/dL — ABNORMAL HIGH (ref 70–99)
Potassium: 3.9 mEq/L (ref 3.5–5.1)
Sodium: 131 mEq/L — ABNORMAL LOW (ref 135–145)
Total Bilirubin: 0.5 mg/dL (ref 0.3–1.2)
Total Protein: 7.7 g/dL (ref 6.0–8.3)

## 2012-05-14 LAB — TROPONIN I: Troponin I: <0.3 ng/mL (ref <0.30)

## 2012-05-14 MED ORDER — ONDANSETRON HCL 4 MG/2ML IJ SOLN
4.0000 mg | Freq: Once | INTRAMUSCULAR | Status: AC
  Administered 2012-05-14: 4 mg via INTRAVENOUS
  Filled 2012-05-14: qty 2

## 2012-05-14 MED ORDER — SODIUM CHLORIDE 0.9 % IV BOLUS (SEPSIS)
1000.0000 mL | Freq: Once | INTRAVENOUS | Status: AC
  Administered 2012-05-14: 1000 mL via INTRAVENOUS

## 2012-05-14 NOTE — ED Provider Notes (Signed)
History  This chart was scribed for Vida Roller, MD by Erskine Emery. This patient was seen in room [facility]07/[facility]07 and the patient's care was started at 23:15.   CSN: [id number]  Arrival date & time 05/14/12  2059   None     Chief Complaint  Patient presents with  . Emesis    no blood noted in emesis    (Consider location/radiation/quality/duration/timing/severity/associated sxs/prior treatment) The history is provided by the patient and the spouse. No language interpreter was used.   Greg Palmer is a 61 y.o. male who presents to the Emergency Department complaining of intermittent emesis (2 episodes today) and nausea for the last two days, with an associated headache since this morning. Pt describes the headache as a stabbing-like pain in the front bilaterally in episodes of a couple seconds. Pt reports he doesn't always have the nausea when he has the headache. Pt denies any associated photophobia, sound sensitivity, rash, dysuria, blurred vision, trouble walking, difficulty with balance, diarrhea, or recent long travels, tick bites, or contact with questionable food or water (his wife eats everything he does and she is perfectly fine). Pt reports he has no other medical conditions and takes no medications other than OTC vitamins and aleve. The pt was here 2 weeks ago for left back/flank pain and dysuria that was diagnosed as kidney stones. The pt was given a CAT scan and all of his symptoms have since subsided. The pt was also hospitalized in 2006 for abdominal pain, whereupon they did a stress test with results described as "okay but could have been better." Pt has no h/o surgery or headaches and has a family history of DM and heart issues on the paternal side.  The pt has no sig PMH, takes not meds.  Fever present at 101.6, sat's of 97%.    The pt's PCP is Dr. Renard Matter  History reviewed. No pertinent past medical history.  History reviewed. No pertinent past surgical  history.  No family history on file.  History  Substance Use Topics  . Smoking status: Never Smoker   . Smokeless tobacco: Not on file  . Alcohol Use: 0.6 oz/week    1 Glasses of wine per week     occasionally      Review of Systems A complete 10 system review of systems was obtained and all systems are negative except as noted in the HPI and PMH.    Allergies  Review of patient's allergies indicates no known allergies.  Home Medications   Current Outpatient Rx  Name Route Sig Dispense Refill  . FLAX SEED OIL 1300 MG PO CAPS Oral Take 1 tablet by mouth 2 (two) times daily.      . ADULT MULTIVITAMIN W/MINERALS CH Oral Take 1 tablet by mouth daily.    Marland Kitchen FISH OIL 1000 MG PO CAPS Oral Take 1 capsule by mouth 2 (two) times daily.      . AZITHROMYCIN 250 MG PO TABS Oral Take 1 tablet (250 mg total) by mouth daily. 500mg  PO day 1, then 250mg  PO days 205 6 tablet 0  . NAPROXEN SODIUM 220 MG PO TABS Oral Take 220 mg by mouth 2 (two) times daily. Pain     . ONDANSETRON 4 MG PO TBDP Oral Take 1 tablet (4 mg total) by mouth every 8 (eight) hours as needed for nausea. 10 tablet 0    Triage Vitals: Pulse 101  Temp 101.6 F (38.7 C) (Oral)  Resp 16  Ht 5'  11" (1.803 m)  Wt 245 lb (111.131 kg)  BMI 34.17 kg/m2  SpO2 97%  Physical Exam  Nursing note and vitals reviewed. Constitutional: He is oriented to person, place, and time. He appears well-developed and well-nourished. No distress.  HENT:  Head: Normocephalic and atraumatic.       Mucus membranes are slightly dry.  Eyes: EOM are normal.  Neck: Neck supple. No tracheal deviation present.  Cardiovascular: Normal rate, regular rhythm and normal heart sounds.   No murmur heard. Pulmonary/Chest: Effort normal and breath sounds normal. No respiratory distress. He has no wheezes.  Abdominal: Soft. He exhibits no distension. There is no tenderness.  Musculoskeletal: Normal range of motion. He exhibits no edema.       Good distal  pulses in the wrist  Neurological: He is alert and oriented to person, place, and time.  Skin: Skin is warm and dry.  Psychiatric: He has a normal mood and affect.    ED Course  Procedures (including critical care time) DIAGNOSTIC STUDIES: Oxygen Saturation is 97% on room air, adequate by my interpretation.    COORDINATION OF CARE: 23:15--I evaluated the patient and we discussed a treatment plan including IV fluids and labs to which the pt agreed.    Labs Reviewed  CBC WITH DIFFERENTIAL - Abnormal; Notable for the following:    Platelets 119 (*)     All other components within normal limits  COMPREHENSIVE METABOLIC PANEL - Abnormal; Notable for the following:    Sodium 131 (*)     Chloride 95 (*)     Glucose, Bld 140 (*)     GFR calc non Af Amer 77 (*)     GFR calc Af Amer 90 (*)     All other components within normal limits  URINALYSIS, ROUTINE W REFLEX MICROSCOPIC - Abnormal; Notable for the following:    Color, Urine AMBER (*)  BIOCHEMICALS MAY BE AFFECTED BY COLOR   All other components within normal limits  TROPONIN I   Dg Chest 2 View  05/15/2012  *RADIOLOGY REPORT*  Clinical Data: Fever, nausea.  CHEST - 2 VIEW  Comparison: 09/24/2006  Findings: Heart size upper normal.  Hilar fullness is similar to prior presumably vascular.  Mild peribronchial thickening is similar to priors.  Mild linear opacity at the right lung base.  No pleural effusion or pneumothorax.  No acute osseous finding.  IMPRESSION: Mild right lung base opacity is likely scarring or atelectasis.  Mild chronic bronchitic change.   Original Report Authenticated By: Waneta Martins, M.D.      1. Fever   2. Atelectasis   3. Thrombocytopenia   4. Hyponatremia   5. Nausea       MDM  The patient's lungs are clear, his abdomen is soft, he has no signs of rash. He does have a fever of 101.6 with a pulse of 100 but normal oxygen saturations and respiratory effort. At this time will get laboratory data,  urinalysis, if no findings would consider chest x-ray to rule out other sources of fever.  CBC with normal white blood cell count, thrombocytopenia of 119, mild hyponatremia at 131 and mild hyperglycemia at 140. There is normal renal function, normal liver function. Troponin is negative, EKG shows no significant findings.  ED ECG REPORT  I personally interpreted this EKG   Date: 05/15/2012   Rate: 88  Rhythm: normal sinus rhythm  QRS Axis: normal  Intervals: normal  ST/T Wave abnormalities: normal  Conduction Disutrbances:none  Narrative Interpretation:   Old EKG Reviewed: none available  X-ray shows a slight infiltrate at the right base, the patient has been informed of all of his abnormal lab results and will followup with his family doctor. Prior to discharge the patient's fever was treated with Tylenol, infection started treatment with Zithromax, patient has no nausea, normal vital signs other than slight fever and is stable for discharge.   I personally performed the services described in this documentation, which was scribed in my presence. The recorded information has been reviewed and considered.      Vida Roller, MD 05/15/12 [phone]

## 2012-05-14 NOTE — ED Notes (Signed)
Awoke 05/13/12 with nausea and vomiting, generalized headache, loss of appetite, denies any fever, no cough.   [place], was exposed to ticks and treated with antibiotics in April.  Also c/o abdominal pain - due to dry heaves.  Emesis today is clear.

## 2012-05-15 ENCOUNTER — Emergency Department (HOSPITAL_COMMUNITY): Payer: BC Managed Care – PPO

## 2012-05-15 LAB — URINALYSIS, ROUTINE W REFLEX MICROSCOPIC
Bilirubin Urine: NEGATIVE
Glucose, UA: NEGATIVE mg/dL
Hgb urine dipstick: NEGATIVE
Ketones, ur: NEGATIVE mg/dL
Leukocytes, UA: NEGATIVE
Nitrite: NEGATIVE
Protein, ur: NEGATIVE mg/dL
Specific Gravity, Urine: 1.025 (ref 1.005–1.030)
Urobilinogen, UA: 1 mg/dL (ref 0.0–1.0)
pH: 6 (ref 5.0–8.0)

## 2012-05-15 MED ORDER — ONDANSETRON 4 MG PO TBDP: 4.0000 mg | ORAL_TABLET | Freq: Three times a day (TID) | ORAL | Status: AC | PRN

## 2012-05-15 MED ORDER — ACETAMINOPHEN 500 MG PO TABS
1000.0000 mg | ORAL_TABLET | Freq: Once | ORAL | Status: AC
  Administered 2012-05-15: 1000 mg via ORAL
  Filled 2012-05-15: qty 2

## 2012-05-15 MED ORDER — AZITHROMYCIN 250 MG PO TABS: 250.0000 mg | ORAL_TABLET | Freq: Every day | ORAL | Status: AC

## 2012-05-15 MED ORDER — SODIUM CHLORIDE 0.9 % IV BOLUS (SEPSIS)
1000.0000 mL | Freq: Once | INTRAVENOUS | Status: AC
Start: 2012-05-15 — End: 2012-05-15
  Administered 2012-05-15: 1000 mL via INTRAVENOUS

## 2012-05-15 MED ORDER — AZITHROMYCIN 250 MG PO TABS
500.0000 mg | ORAL_TABLET | Freq: Once | ORAL | Status: AC
  Administered 2012-05-15: 500 mg via ORAL
  Filled 2012-05-15: qty 2

## 2012-05-15 NOTE — ED Notes (Signed)
Reviewed D/C instructions with pt. Pt verbalized understanding.   

## 2012-07-12 ENCOUNTER — Encounter (HOSPITAL_COMMUNITY): Payer: Self-pay | Admitting: *Deleted

## 2012-07-12 ENCOUNTER — Emergency Department (HOSPITAL_COMMUNITY)
Admission: EM | Admit: 2012-07-12 | Discharge: 2012-07-12 | Disposition: A | Discharge status: Home / Self Care | Payer: BC Managed Care – PPO | Attending: Emergency Medicine | Admitting: Emergency Medicine

## 2012-07-12 DIAGNOSIS — W57XXXA Bitten or stung by nonvenomous insect and other nonvenomous arthropods, initial encounter: Secondary | ICD-10-CM | POA: Insufficient documentation

## 2012-07-12 DIAGNOSIS — S30860A Insect bite (nonvenomous) of lower back and pelvis, initial encounter: Secondary | ICD-10-CM | POA: Insufficient documentation

## 2012-07-12 DIAGNOSIS — F1210 Cannabis abuse, uncomplicated: Secondary | ICD-10-CM | POA: Insufficient documentation

## 2012-07-12 DIAGNOSIS — Y929 Unspecified place or not applicable: Secondary | ICD-10-CM | POA: Insufficient documentation

## 2012-07-12 DIAGNOSIS — Z79899 Other long term (current) drug therapy: Secondary | ICD-10-CM | POA: Insufficient documentation

## 2012-07-12 DIAGNOSIS — Y939 Activity, unspecified: Secondary | ICD-10-CM | POA: Insufficient documentation

## 2012-07-12 NOTE — ED Provider Notes (Signed)
History     CSN: [id number]  Arrival date & time 07/12/12  0930   First MD Initiated Contact with Patient 07/12/12 [phone]      Chief Complaint  Patient presents with  . tick bite     (Consider location/radiation/quality/duration/timing/severity/associated sxs/prior treatment) HPI Comments: Greg Palmer presents with a tick bite to his chest which was removed using tweezers this am by his wife.  He is concerned for possible retained tick.  He denies fevers, chills, nausea and has had no headache or rash.  He describes the tick as flat,  And probably has not been on him more than a day.  He is otherwise without complaint.  The history is provided by the patient.    History reviewed. No pertinent past medical history.  History reviewed. No pertinent past surgical history.  No family history on file.  History  Substance Use Topics  . Smoking status: Never Smoker   . Smokeless tobacco: Not on file  . Alcohol Use: 0.6 oz/week    1 Glasses of wine per week     occasionally      Review of Systems  Constitutional: Negative for fever and chills.  HENT: Negative.  Negative for facial swelling.   Respiratory: Negative.  Negative for shortness of breath and wheezing.   Cardiovascular: Negative.   Gastrointestinal: Negative.   Skin: Negative for rash.  Neurological: Negative for numbness.    Allergies  Review of patient's allergies indicates no known allergies.  Home Medications   Current Outpatient Rx  Name Route Sig Dispense Refill  . FLAX SEED OIL 1300 MG PO CAPS Oral Take 1 tablet by mouth 2 (two) times daily.      . ADULT MULTIVITAMIN W/MINERALS CH Oral Take 1 tablet by mouth daily.    Marland Kitchen NAPROXEN SODIUM 220 MG PO TABS Oral Take 220 mg by mouth 2 (two) times daily. Pain     . FISH OIL 1000 MG PO CAPS Oral Take 1 capsule by mouth 2 (two) times daily.        BP 145/89  Pulse 82  Temp 98.2 F (36.8 C) (Oral)  Resp 16  Ht 5\' 11"  (1.803 m)  Wt 245 lb (111.131 kg)   BMI 34.17 kg/m2  SpO2 99%  Physical Exam  Constitutional: He is oriented to person, place, and time. He appears well-developed and well-nourished.  HENT:  Head: Normocephalic and atraumatic.  Eyes: Conjunctivae normal are normal.  Pulmonary/Chest: Effort normal. No respiratory distress. He has no wheezes. He has no rales.  Abdominal: Soft. There is no tenderness.  Musculoskeletal: Normal range of motion.  Neurological: He is alert and oriented to person, place, and time.  Skin: Skin is warm and dry. No rash noted.       0.2 erythematous macule at site of tick bite with central dark spot.  Psychiatric: He has a normal mood and affect.    ED Course  Procedures (including critical care time)  Labs Reviewed - No data to display No results found.   1. Tick bite     10:30 AM retained FB (tick pincher) easily removed using magnification and forceps.  Pt tolerated well   MDM  PRN f/u.  Pt advised to get rechecked for any unexplained rash, fever, headache within the next 30 days.  Pt understands plan.        Burgess Amor, PA 07/12/12 1031

## 2012-07-12 NOTE — ED Notes (Signed)
Pt states tick was removed from right upper chest this morning and wants to make sure his wife got it all out

## 2012-07-14 NOTE — ED Provider Notes (Signed)
Medical screening examination/treatment/procedure(s) were performed by non-physician practitioner and as supervising physician I was immediately available for consultation/collaboration.   Laray Anger, DO 07/14/12 1655

## 2013-02-09 ENCOUNTER — Emergency Department (HOSPITAL_COMMUNITY): Payer: BC Managed Care – PPO

## 2013-02-09 ENCOUNTER — Encounter (HOSPITAL_COMMUNITY): Payer: Self-pay | Admitting: Emergency Medicine

## 2013-02-09 ENCOUNTER — Emergency Department (HOSPITAL_COMMUNITY)
Admission: EM | Admit: 2013-02-09 | Discharge: 2013-02-09 | Disposition: A | Discharge status: Home / Self Care | Payer: BC Managed Care – PPO | Attending: Emergency Medicine | Admitting: Emergency Medicine

## 2013-02-09 DIAGNOSIS — R062 Wheezing: Secondary | ICD-10-CM | POA: Insufficient documentation

## 2013-02-09 DIAGNOSIS — J209 Acute bronchitis, unspecified: Secondary | ICD-10-CM | POA: Insufficient documentation

## 2013-02-09 DIAGNOSIS — R059 Cough, unspecified: Secondary | ICD-10-CM | POA: Insufficient documentation

## 2013-02-09 DIAGNOSIS — R05 Cough: Secondary | ICD-10-CM | POA: Insufficient documentation

## 2013-02-09 MED ORDER — ALBUTEROL SULFATE (5 MG/ML) 0.5% IN NEBU
5.0000 mg | INHALATION_SOLUTION | Freq: Once | RESPIRATORY_TRACT | Status: AC
  Administered 2013-02-09: 5 mg via RESPIRATORY_TRACT
  Filled 2013-02-09: qty 1

## 2013-02-09 MED ORDER — ALBUTEROL SULFATE HFA 108 (90 BASE) MCG/ACT IN AERS: 2.0000 | INHALATION_SPRAY | RESPIRATORY_TRACT | Status: AC | PRN

## 2013-02-09 MED ORDER — AZITHROMYCIN 250 MG PO TABS: ORAL_TABLET | ORAL | Status: DC

## 2013-02-09 MED ORDER — IPRATROPIUM BROMIDE 0.02 % IN SOLN
0.5000 mg | Freq: Once | RESPIRATORY_TRACT | Status: AC
  Administered 2013-02-09: 0.5 mg via RESPIRATORY_TRACT
  Filled 2013-02-09: qty 2.5

## 2013-02-09 MED ORDER — ALBUTEROL SULFATE (5 MG/ML) 0.5% IN NEBU
2.5000 mg | INHALATION_SOLUTION | Freq: Once | RESPIRATORY_TRACT | Status: AC
  Administered 2013-02-09: 2.5 mg via RESPIRATORY_TRACT
  Filled 2013-02-09: qty 0.5

## 2013-02-09 MED ORDER — ALBUTEROL SULFATE HFA 108 (90 BASE) MCG/ACT IN AERS
2.0000 | INHALATION_SPRAY | RESPIRATORY_TRACT | Status: DC | PRN
  Filled 2013-02-09: qty 6.7

## 2013-02-09 MED ORDER — AZITHROMYCIN 250 MG PO TABS
500.0000 mg | ORAL_TABLET | Freq: Once | ORAL | Status: AC
  Administered 2013-02-09: 500 mg via ORAL
  Filled 2013-02-09: qty 2

## 2013-02-09 NOTE — ED Provider Notes (Signed)
History     CSN: [id number]  Arrival date & time 02/09/13  1900   First MD Initiated Contact with Patient 02/09/13 1915      Chief Complaint  Patient presents with  . Cough  . Shortness of Breath    (Consider location/radiation/quality/duration/timing/severity/associated sxs/prior treatment) Patient is a 62 y.o. male presenting with cough and shortness of breath. The history is provided by the patient and the spouse.  Cough Associated symptoms: shortness of breath and wheezing   Associated symptoms: no chest pain, no chills, no headaches and no rash   Shortness of Breath Associated symptoms: cough and wheezing   Associated symptoms: no abdominal pain, no chest pain, no headaches, no neck pain, no rash and no vomiting   pt c/o 1 week hx productive cough, greenish phlegm, and wheezing. Cough episodic.  Notes hx wheezing w prior uri's, at which point he has used inhalers in past, but denies hx asthma or copd. Non smoker. Denies sore throat or runny nose. No chest pain or discomfort. Denies orthopnea or pnd. No leg pain or swelling. No hx cad. No hx dvt or pe. No hx chf.subj fever, no chills/sweats.   History reviewed. No pertinent past medical history.  History reviewed. No pertinent past surgical history.  History reviewed. No pertinent family history.  History  Substance Use Topics  . Smoking status: Never Smoker   . Smokeless tobacco: Not on file  . Alcohol Use: 0.6 oz/week    1 Glasses of wine per week     Comment: occasionally      Review of Systems  Constitutional: Negative for chills.  HENT: Negative for neck pain.   Eyes: Negative for redness.  Respiratory: Positive for cough, shortness of breath and wheezing.   Cardiovascular: Negative for chest pain, palpitations and leg swelling.  Gastrointestinal: Negative for vomiting and abdominal pain.  Genitourinary: Negative for flank pain.  Musculoskeletal: Negative for back pain.  Skin: Negative for rash.   Neurological: Negative for headaches.  Hematological: Does not bruise/bleed easily.  Psychiatric/Behavioral: Negative for confusion.    Allergies  Cats claw  Home Medications   Current Outpatient Rx  Name  Route  Sig  Dispense  Refill  . Flaxseed, Linseed, (FLAX SEED OIL) 1300 MG CAPS   Oral   Take 1 tablet by mouth 2 (two) times daily.           . Multiple Vitamin (MULTIVITAMIN WITH MINERALS) TABS   Oral   Take 1 tablet by mouth daily.         . naproxen sodium (ALEVE) 220 MG tablet   Oral   Take 220 mg by mouth 2 (two) times daily. Pain          . Omega-3 Fatty Acids (FISH OIL) 1000 MG CAPS   Oral   Take 1 capsule by mouth 2 (two) times daily.             BP 144/91  Pulse 82  Temp(Src) 98.6 F (37 C) (Oral)  Resp 18  Ht 5\' 11"  (1.803 m)  Wt 250 lb (113.399 kg)  BMI 34.88 kg/m2  SpO2 97%  Physical Exam  Nursing note and vitals reviewed. Constitutional: He is oriented to person, place, and time. He appears well-developed and well-nourished. No distress.  HENT:  Nose: Nose normal.  Mouth/Throat: Oropharynx is clear and moist.  Eyes: Conjunctivae are normal. No scleral icterus.  Neck: Neck supple. No JVD present. No tracheal deviation present.  Cardiovascular: Normal rate, regular  rhythm, normal heart sounds and intact distal pulses.  Exam reveals no gallop and no friction rub.   No murmur heard. Pulmonary/Chest: Effort normal. No accessory muscle usage. No respiratory distress. He has wheezes.  Abdominal: Soft. He exhibits no distension. There is no tenderness.  Musculoskeletal: Normal range of motion. He exhibits no edema and no tenderness.  Neurological: He is alert and oriented to person, place, and time.  Skin: Skin is warm and dry.  Psychiatric: He has a normal mood and affect.    ED Course  Procedures (including critical care time)  Results for orders placed during the hospital encounter of 05/14/12  CBC WITH DIFFERENTIAL      Result Value  Range   WBC 6.8  4.0 - 10.5 K/uL   RBC 4.93  4.22 - 5.81 MIL/uL   Hemoglobin 15.5  13.0 - 17.0 g/dL   HCT 13.0  86.5 - 78.4 %   MCV 89.0  78.0 - 100.0 fL   MCH 31.4  26.0 - 34.0 pg   MCHC 35.3  30.0 - 36.0 g/dL   RDW 69.6  29.5 - 28.4 %   Platelets 119 (*) 150 - 400 K/uL   Neutrophils Relative % 66  43 - 77 %   Neutro Abs 4.5  1.7 - 7.7 K/uL   Lymphocytes Relative 22  12 - 46 %   Lymphs Abs 1.5  0.7 - 4.0 K/uL   Monocytes Relative 11  3 - 12 %   Monocytes Absolute 0.8  0.1 - 1.0 K/uL   Eosinophils Relative 0  0 - 5 %   Eosinophils Absolute 0.0  0.0 - 0.7 K/uL   Basophils Relative 1  0 - 1 %   Basophils Absolute 0.1  0.0 - 0.1 K/uL  COMPREHENSIVE METABOLIC PANEL      Result Value Range   Sodium 131 (*) 135 - 145 mEq/L   Potassium 3.9  3.5 - 5.1 mEq/L   Chloride 95 (*) 96 - 112 mEq/L   CO2 25  19 - 32 mEq/L   Glucose, Bld 140 (*) 70 - 99 mg/dL   BUN 14  6 - 23 mg/dL   Creatinine, Ser 1.32  0.50 - 1.35 mg/dL   Calcium 9.5  8.4 - 44.0 mg/dL   Total Protein 7.7  6.0 - 8.3 g/dL   Albumin 3.8  3.5 - 5.2 g/dL   AST 32  0 - 37 U/L   ALT 34  0 - 53 U/L   Alkaline Phosphatase 83  39 - 117 U/L   Total Bilirubin 0.5  0.3 - 1.2 mg/dL   GFR calc non Af Amer 77 (*) >90 mL/min   GFR calc Af Amer 90 (*) >90 mL/min  URINALYSIS, ROUTINE W REFLEX MICROSCOPIC      Result Value Range   Color, Urine AMBER (*) YELLOW   APPearance CLEAR  CLEAR   Specific Gravity, Urine 1.025  1.005 - 1.030   pH 6.0  5.0 - 8.0   Glucose, UA NEGATIVE  NEGATIVE mg/dL   Hgb urine dipstick NEGATIVE  NEGATIVE   Bilirubin Urine NEGATIVE  NEGATIVE   Ketones, ur NEGATIVE  NEGATIVE mg/dL   Protein, ur NEGATIVE  NEGATIVE mg/dL   Urobilinogen, UA 1.0  0.0 - 1.0 mg/dL   Nitrite NEGATIVE  NEGATIVE   Leukocytes, UA NEGATIVE  NEGATIVE  TROPONIN I      Result Value Range   Troponin I <0.30  <0.30 ng/mL   Dg Chest 2 View  02/09/2013   *RADIOLOGY REPORT*  Clinical Data: Chest pain, cough, shortness of breath and  weakness.  CHEST - 2 VIEW  Comparison: 05/15/2012  Findings: Heart size is normal.  There is perihilar peribronchial thickening.  There are no focal consolidations or pleural effusions.  No pulmonary edema. Visualized osseous structures have a normal appearance.  IMPRESSION:  1.  Bronchitic changes. 2. No focal pulmonary abnormality.   Original Report Authenticated By: Norva Pavlov, M.D.       MDM  Albuterol and atrovent neb.  Cxr.  Reviewed nursing notes and prior charts for additional history.    Date: 02/09/2013  Rate: 87  Rhythm: normal sinus rhythm  QRS Axis: left  Intervals: normal  ST/T Wave abnormalities: normal  Conduction Disutrbances:IRBBB  Narrative Interpretation:   Old EKG Reviewed: unchanged  Recheck mild wheeze. Alb/atrovent neb.  Recheck no wheezing or increased wob. Good air exchange. No increased wob.   Will rx zithromax/alb mdi.  Pt stable for d/c.         Suzi Roots, MD 02/09/13 2056

## 2013-02-09 NOTE — ED Notes (Addendum)
Patient complaining of cough x 1 week with shortness of breath x 3 days. States he is coughing green sputum. Also complaining of upper abdominal pain with deep breathing.

## 2013-10-10 ENCOUNTER — Emergency Department (HOSPITAL_COMMUNITY): Payer: BC Managed Care – PPO

## 2013-10-10 ENCOUNTER — Encounter (HOSPITAL_COMMUNITY): Payer: Self-pay | Admitting: Emergency Medicine

## 2013-10-10 ENCOUNTER — Emergency Department (HOSPITAL_COMMUNITY)
Admission: EM | Admit: 2013-10-10 | Discharge: 2013-10-10 | Disposition: A | Discharge status: Home / Self Care | Payer: BC Managed Care – PPO | Attending: Emergency Medicine | Admitting: Emergency Medicine

## 2013-10-10 DIAGNOSIS — Z79899 Other long term (current) drug therapy: Secondary | ICD-10-CM | POA: Insufficient documentation

## 2013-10-10 DIAGNOSIS — R1110 Vomiting, unspecified: Secondary | ICD-10-CM | POA: Insufficient documentation

## 2013-10-10 DIAGNOSIS — Z862 Personal history of diseases of the blood and blood-forming organs and certain disorders involving the immune mechanism: Secondary | ICD-10-CM | POA: Insufficient documentation

## 2013-10-10 DIAGNOSIS — Z8639 Personal history of other endocrine, nutritional and metabolic disease: Secondary | ICD-10-CM | POA: Insufficient documentation

## 2013-10-10 DIAGNOSIS — J40 Bronchitis, not specified as acute or chronic: Secondary | ICD-10-CM

## 2013-10-10 DIAGNOSIS — J069 Acute upper respiratory infection, unspecified: Secondary | ICD-10-CM | POA: Insufficient documentation

## 2013-10-10 DIAGNOSIS — J209 Acute bronchitis, unspecified: Secondary | ICD-10-CM | POA: Insufficient documentation

## 2013-10-10 DIAGNOSIS — Z7982 Long term (current) use of aspirin: Secondary | ICD-10-CM | POA: Insufficient documentation

## 2013-10-10 HISTORY — DX: Hyperlipidemia, unspecified: E78.5

## 2013-10-10 LAB — RAPID STREP SCREEN (MED CTR MEBANE ONLY): STREPTOCOCCUS, GROUP A SCREEN (DIRECT): NEGATIVE

## 2013-10-10 LAB — POCT I-STAT, CHEM 8
BUN: 13 mg/dL (ref 6–23)
CALCIUM ION: 1.15 mmol/L (ref 1.13–1.30)
CREATININE: 1.1 mg/dL (ref 0.50–1.35)
Chloride: 100 mEq/L (ref 96–112)
Glucose, Bld: 155 mg/dL — ABNORMAL HIGH (ref 70–99)
HCT: 47 % (ref 39.0–52.0)
HEMOGLOBIN: 16 g/dL (ref 13.0–17.0)
Potassium: 4.6 mEq/L (ref 3.7–5.3)
SODIUM: 139 meq/L (ref 137–147)
TCO2: 26 mmol/L (ref 0–100)

## 2013-10-10 LAB — POCT I-STAT TROPONIN I: Troponin i, poc: 0 ng/mL (ref 0.00–0.08)

## 2013-10-10 MED ORDER — IPRATROPIUM BROMIDE 0.02 % IN SOLN
0.5000 mg | Freq: Once | RESPIRATORY_TRACT | Status: AC
  Administered 2013-10-10: 0.5 mg via RESPIRATORY_TRACT
  Filled 2013-10-10: qty 2.5

## 2013-10-10 MED ORDER — AZITHROMYCIN 250 MG PO TABS: ORAL_TABLET | ORAL | Status: DC

## 2013-10-10 MED ORDER — ALBUTEROL SULFATE HFA 108 (90 BASE) MCG/ACT IN AERS
2.0000 | INHALATION_SPRAY | RESPIRATORY_TRACT | Status: AC
  Administered 2013-10-10: 2 via RESPIRATORY_TRACT
  Filled 2013-10-10: qty 6.7

## 2013-10-10 MED ORDER — ACETAMINOPHEN 325 MG PO TABS
650.0000 mg | ORAL_TABLET | Freq: Once | ORAL | Status: AC
  Administered 2013-10-10: 650 mg via ORAL
  Filled 2013-10-10: qty 2

## 2013-10-10 MED ORDER — ALBUTEROL SULFATE (2.5 MG/3ML) 0.083% IN NEBU
5.0000 mg | INHALATION_SOLUTION | Freq: Once | RESPIRATORY_TRACT | Status: AC
  Administered 2013-10-10: 5 mg via RESPIRATORY_TRACT
  Filled 2013-10-10: qty 6

## 2013-10-10 NOTE — Discharge Instructions (Signed)
°Emergency Department Resource Guide °1) Find a Doctor and Pay Out of Pocket °Although you won't have to find out who is covered by your insurance plan, it is a good idea to ask around and get recommendations. You will then need to call the office and see if the doctor you have chosen will accept you as a new patient and what types of options they offer for patients who are self-pay. Some doctors offer discounts or will set up payment plans for their patients who do not have insurance, but you will need to ask so you aren't surprised when you get to your appointment. ° °2) Contact Your Local Health Department °Not all health departments have doctors that can see patients for sick visits, but many do, so it is worth a call to see if yours does. If you don't know where your local health department is, you can check in your phone book. The CDC also has a tool to help you locate your state's health department, and many state websites also have listings of all of their local health departments. ° °3) Find a Walk-in Clinic °If your illness is not likely to be very severe or complicated, you may want to try a walk in clinic. These are popping up all over the country in pharmacies, drugstores, and shopping centers. They're usually staffed by nurse practitioners or physician assistants that have been trained to treat common illnesses and complaints. They're usually fairly quick and inexpensive. However, if you have serious medical issues or chronic medical problems, these are probably not your best option. ° °No Primary Care Doctor: °- Call [facility] at  [phone] - they can help you locate a primary care doctor that  accepts your insurance, provides certain services, etc. °- Physician Referral Service- 1-800-533-3463 ° °Chronic Pain Problems: °Organization         Address  Phone   Notes  °[facility]  ([phone]) [phone] Patients need to be referred by their primary care doctor.  ° °Medication  Assistance: °Organization         Address  Phone   Notes  °[place] [facility] [address] °[place], [place] [address] (336) 641-8030 --Must be a resident of [place] °-- Must have NO insurance coverage whatsoever (no Medicaid/ Medicare, etc.) °-- The pt. MUST have a primary care doctor that directs their care regularly and follows them in the community °  °[facility]  (866) 331-1348   °[employer]  (888) 892-1162   ° °Agencies that provide inexpensive medical care: °Organization         Address  Phone   Notes  °[facility]  (336) 832-8035   °[facility]    (336) 832-7272   °[facility] [address] °[place], [place] [address] (336) 832-4777   °[facility] [address], °[place] (336) 271-4999   °[facility]    (336) 373-0678   °[facility]    (336) 272-1050   °[facility] ° [address], [place] Phone:  (336) 832-4444, Fax:  (336) 832-4440 Hours of Operation:  9 am - 6 pm, M-F.  Also accepts Medicaid/Medicare and self-pay.  °[facility] ° [address], [place] Phone: (336) [phone], Fax: (336) [phone]. Hours of Operation:  8:30 am - 5:30 pm, M-F.  Also accepts Medicaid and self-pay.  °[facility] [place] [address], [place] Phone: (336) 878-6027   °[facility] [address], [place], [place] (336)723-1848, Ext. 123 Mondays & Thursdays: 7-9 AM.  First 15 patients are seen on a first come, first serve basis. °  ° °Medicaid-accepting [place] Providers: ° °Organization         Address  Phone   Notes  °[facility] [address], [place] (336) [phone] Also accepts self-pay patients.  °[facility] [address], [place] ° (336) 856-9996   °[facility] [address], [place]  (336) 288-8857   °[facility] [address] [place] [address], [place] (336) 299-7000   °Veita Bland [address], [place]  ° (336) 373-1557 Only accepts [place] Access Medicaid patients after they have their name applied to their card.  ° °Self-Pay (no insurance) in [place]: ° °Organization         Address  Phone   Notes  °Sickle Cell Patients, [facility] [address], [place] (336) 832-1970   °[facility] [address], [place] (336) 832-4400   °[facility] ° [address], [place] (336) 992-4800   °[facility]/Dr. Osei-Bonsu ° [address] [place] [address], [place] or [address], [place] (336) [phone] Phone number for both [place] and [place] locations is the same.  °[facility] [address], [place] (336) 299-0000   °[facility] [address] [place] [address], [place] or [address] (336) 852-7530 °(336) 878-2260   °[facility] [address], [place] (336) 350-1642, phone; (336) 294-5005, fax Sees patients 1st and 3rd Saturday of every month.  Must not qualify for public or private insurance (i.e. Medicaid, Medicare, [place] Health Choice, Veterans' Benefits) • Household income should be no more than 200% of the poverty level •The clinic cannot treat you if you are pregnant or think you are pregnant • Sexually transmitted diseases are not treated at the clinic.  ° ° °Dental Care: °Organization         Address  Phone  Notes  °[place] [facility] [address], [place] (336) 641-6152 Accepts children up to age 21 who are enrolled in Medicaid or [place] Health Choice; pregnant women with a Medicaid card; and children who have applied for Medicaid or [place] Health Choice, but were declined, whose parents can pay a reduced fee at time of service.  °[place]  [facility] [place]  [address], [place] (336) 641-7733 Accepts children up to age 21 who are enrolled in Medicaid or [place] Health Choice; pregnant women with a Medicaid card; and children who have applied for Medicaid or [place] Health Choice, but were declined, whose parents can pay a reduced fee at time of service.  °[facility] ° [address], [place] (336) 641-4533 Patients are seen by appointment only. Walk-ins are not accepted. [facility] will see patients 18 years of age and older. °Monday - Tuesday (8am-5pm) °Most Wednesdays (8:30-5pm) °$30 per visit, cash only  °[facility] ° [address], [place] (336) 641-4533 Patients are seen by appointment only. Walk-ins are not accepted. [facility] will see patients 18 years of age and older. °One   Wednesday Evening (Monthly: Volunteer Based).  $30 per visit, cash only  °[facility]  (919) [phone] for adults; Children under age 4, call Graduate Pediatric Dentistry at ([phone]) [phone]. Children aged 4-14, please call ([phone]) [phone] to request a pediatric application. ° Dental services are provided in all areas of dental care including fillings, crowns and bridges, complete and partial dentures, implants, gum treatment, root canals, and extractions. Preventive care is also provided. Treatment is provided to both adults and children. °Patients are selected via a lottery and there is often a waiting list. °  °[facility] [address], °[place] ° (336) 763-8833 [email or web address] °  °[facility] [address], [place], [place] (336)723-1848, Ext. 123 Second and Fourth Thursday of each month, opens at 6:30 AM; Clinic ends at 9 AM.  Patients are seen on a first-come first-served basis, and a limited number are seen during each clinic.  ° °[facility] ° [address], [place], [place] (336) [phone]    Eligibility Requirements °You must have lived in [place], [place], or [place] counties for at least the last three months. °  You cannot be eligible for state or federal sponsored healthcare insurance, including Veterans Administration, Medicaid, or Medicare. °  You generally cannot be eligible for healthcare insurance through your employer.  °  How to apply: °Eligibility screenings are held every Tuesday and Wednesday afternoon from 1:00 pm until 4:00 pm. You do not need an appointment for the interview!  °[facility] [address], [place], [place] 336-631-2330   °[facility]  336-342-8273   °[facility]  336-703-3100   °[facility]  336-570-6415   ° °Behavioral Health Resources in the Community: °Intensive Outpatient Programs °Organization         Address  Phone  Notes  °[facility] [address], [place], [place] 336-878-6098   °[facility] [address], [place], [place] 336-832-9800   °[facility] [address], [place], [place] ° 336-882-2125   °[facility] [address],  °[place], [place] 1-800-853-5163 or 336-641-4981   °Substance Abuse Resources °Organization         Address  Phone  Notes  °[facility]  336-882-2125   °[facility]  336-784-9470   °[facility]  336-285-9073   °[facility]  336-845-3988   °Residential & Outpatient Substance Abuse Program  1-800-659-3381   °Psychological Services °Organization         Address  Phone  Notes  °[facility]  336- 832-9600   °[facility]  336- 378-7881   °[facility] [address], [place] 1-800-853-5163 or 336-641-4981   ° °Mobile Crisis Teams °Organization         Address  Phone  Notes  °[facility], Mobile Crisis Care Unit  1-877-626-1772   °Assertive °[facility] ° [address]  [place], [place] 336-834-9664   °Sharon DeEsch [address] °[place] 336-554-5454   ° °Self-Help/Support Groups °Organization         Address  Phone             Notes  °[facility]  - variety of support groups  336- 373-1402 Call for more information  °Narcotics Anonymous (NA), [facility] [address], °[place]  2 meetings at this location  ° °  Residential Treatment Programs Organization         Address  Phone  Notes  [facility] [address],    [place]  [phone]   [facility]  [address], [place] [phone], [place], [place] [phone]   [facility] [address], [place] [phone] Admissions: 8am-3pm M-F  [facility] [address]    [place], [place] [phone] [phone]   [facility] [address], [place], [place] [phone]   [facility] [address]  [place], [place] [phone]   [facility] Intensive Outpatient [address], [place], [place] [phone]   [facility] [address]  [place], [place] [phone] or [phone]   [facility] ([facility]) [address] [place], [place] [phone] Accepts Medicaid  [facility] [address]  [place] [phone] Substance Abuse/Addiction Treatment   [facility] Resources Organization         Address  Phone  Notes  [facility]  (641)659-0457(888) [phone]   Angie FavaJulie Brannon, PhD [address], [address] [place], [place]   ([phone](336) [phone] or ([phone](336) [phone]   [facility]   [address] [place], [place] [phone]) [phone]   [facility] [address], [place], [place] [phone](336) [phone] Insurance/Medicaid/sponsorship through [facility] [address]                                    [place], [place] [phone]) [phone] Therapy/tele-psych/case    [facility] [address]   [place], [place] [phone](336) [phone]    Dr. Lolly MustacheArfeen  [phone]) [phone]   [facility] [place]  [employer] [facility] 1) [address], [place] 2) [address], [place] 3)  [address] [place] [address], [place] [phone](336) [phone](336) ([phone]  [phone](336) [phone]   [facility] Child Abuse Hotline 615-656-9035(336) (210)153-3220 or (727) [phone](336) [phone] (After Hours)       Take the prescription as directed. Take over the counter tylenol and ibuprofen, as directed on packaging, as needed for fever/discomfort.  Use your albuterol inhaler (2 to 4 puffs) every 4 hours for the next 7 days, then as needed for cough, wheezing, or shortness of breath.  Call your regular medical doctor today to schedule a follow up appointment within the next 3 days.  Return to the Emergency Department immediately sooner if worsening.

## 2013-10-10 NOTE — ED Notes (Signed)
Pt c/o cough/congestion/fever.

## 2013-10-10 NOTE — ED Notes (Signed)
RT in to complete nebulizer.

## 2013-10-10 NOTE — ED Notes (Signed)
Patient ambulated around department.  O2 sats 93-94%; HR 133

## 2013-10-10 NOTE — ED Provider Notes (Signed)
CSN: [id number]     Arrival date & time 10/10/13  [id number] History   First MD Initiated Contact with Patient 10/10/13 [phone]     Chief Complaint  Patient presents with  . Cough    HPI Pt was seen at 0730.  Per pt, c/o gradual onset and persistence of constant sore throat, runny/stuffy nose, sinus congestion, and cough for the past 3 days.  Has been associated with home fevers to "100.4," generalized body aches/fatigue and post tussive emesis. Denies N/V without coughing. States he has been "using my wife's inhaler" for his cough with improvement. Denies rash, no CP/SOB, no N/V/D, no abd pain.     Past Medical History  Diagnosis Date  . Hyperlipidemia    History reviewed. No pertinent past surgical history.  History  Substance Use Topics  . Smoking status: Never Smoker   . Smokeless tobacco: Not on file  . Alcohol Use: 0.6 oz/week    1 Glasses of wine per week     Comment: occasionally    Review of Systems ROS: Statement: All systems negative except as marked or noted in the HPI; Constitutional: Negative for fever and +chills, generalized body aches/fatigue. ; ; Eyes: Negative for eye pain, redness and discharge. ; ; ENMT: Negative for ear pain, hoarseness, +nasal congestion, sinus pressure and sore throat. ; ; Cardiovascular: Negative for chest pain, palpitations, diaphoresis, dyspnea and peripheral edema. ; ; Respiratory: +cough, post-tussive emesis. Negative for wheezing and stridor. ; ; Gastrointestinal: Negative for nausea, vomiting, diarrhea, abdominal pain, blood in stool, hematemesis, jaundice and rectal bleeding. . ; ; Genitourinary: Negative for dysuria, flank pain and hematuria. ; ; Musculoskeletal: Negative for back pain and neck pain. Negative for swelling and trauma.; ; Skin: Negative for pruritus, rash, abrasions, blisters, bruising and skin lesion.; ; Neuro: Negative for headache, lightheadedness and neck stiffness. Negative for weakness, altered level of consciousness , altered  mental status, extremity weakness, paresthesias, involuntary movement, seizure and syncope.      Allergies  Cats claw  Home Medications   Current Outpatient Rx  Name  Route  Sig  Dispense  Refill  . albuterol (PROVENTIL HFA;VENTOLIN HFA) 108 (90 BASE) MCG/ACT inhaler   Inhalation   Inhale 2 puffs into the lungs every 4 (four) hours as needed for wheezing.   1 Inhaler   1   . aspirin EC 81 MG tablet   Oral   Take 81 mg by mouth daily.         Marland Kitchen. GARLIC PO   Oral   Take 1 tablet by mouth daily.         . Multiple Vitamins-Minerals (MULTIVITAMINS THER. W/MINERALS) TABS tablet   Oral   Take 1 tablet by mouth daily.         . Omega-3 Fatty Acids (FISH OIL PO)   Oral   Take 1 capsule by mouth daily.          BP 152/78  Pulse 115  Temp(Src) 100.4 F (38 C)  Resp 18  Ht 5\' 11"  (1.803 m)  Wt 250 lb (113.399 kg)  BMI 34.88 kg/m2  SpO2 94% Physical Exam 0735: Physical examination:  Nursing notes reviewed; Vital signs and O2 SAT reviewed;  Constitutional: Well developed, Well nourished, Well hydrated, In no acute distress; Head:  Normocephalic, atraumatic; Eyes: EOMI, PERRL, No scleral icterus; ENMT: TM's clear bilat. +edemetous nasal turbinates bilat with clear rhinorrhea. Mouth and pharynx without lesions. No tonsillar exudates. No intra-oral edema. No submandibular or sublingual  edema. No hoarse voice, no drooling, no stridor. No pain with manipulation of larynx.  Mouth and pharynx normal, Mucous membranes moist; Neck: Supple, Full range of motion, No lymphadenopathy; Cardiovascular: Tachycardic rate and rhythm, No gallop; Respiratory: Breath sounds diminished/coarse & equal bilaterally, No wheezes.  Speaking full sentences with ease, Normal respiratory effort/excursion; Chest: Nontender, Movement normal; Abdomen: Soft, Nontender, Nondistended, Normal bowel sounds; Genitourinary: No CVA tenderness; Extremities: Pulses normal, No tenderness, No edema, No calf edema or  asymmetry.; Neuro: AA&Ox3, Major CN grossly intact.  Speech clear. No gross focal motor or sensory deficits in extremities. Climbs on and off stretcher easily by himself. Gait steady.; Skin: Color normal, Warm, Dry.   ED Course  Procedures   EKG Interpretation    Date/Time:  Friday October 10 2013 07:44:16 EST Ventricular Rate:  112 PR Interval:  166 QRS Duration: 94 QT Interval:  316 QTC Calculation: 431 R Axis:   -54 Text Interpretation:  Sinus tachycardia Left axis deviation Incomplete right bundle branch block Left anterior fascicular block Abnormal ECG When compared with ECG of 09-Feb-2013 19:12, No significant change was found Confirmed by [facility]  MD, Nicholos Johns [phone]) on 10/10/2013 8:00:10 AM            MDM  MDM Reviewed: previous chart, nursing note and vitals Reviewed previous: labs, ECG and x-ray Interpretation: labs, ECG and x-ray   Results for orders placed during the hospital encounter of 10/10/13  RAPID STREP SCREEN      Result Value Range   Streptococcus, Group A Screen (Direct) NEGATIVE  NEGATIVE  POCT I-STAT, CHEM 8      Result Value Range   Sodium 139  137 - 147 mEq/L   Potassium 4.6  3.7 - 5.3 mEq/L   Chloride 100  96 - 112 mEq/L   BUN 13  6 - 23 mg/dL   Creatinine, Ser 0.86  0.50 - 1.35 mg/dL   Glucose, Bld 578 (*) 70 - 99 mg/dL   Calcium, Ion 4.69  6.29 - 1.30 mmol/L   TCO2 26  0 - 100 mmol/L   Hemoglobin 16.0  13.0 - 17.0 g/dL   HCT 52.8  41.3 - 24.4 %  POCT I-STAT TROPONIN I      Result Value Range   Troponin i, poc 0.00  0.00 - 0.08 ng/mL   Comment 3            Dg Chest 2 View 10/10/2013   CLINICAL DATA:  Cough and fever  EXAM: CHEST  2 VIEW  COMPARISON:  Feb 09, 2013  FINDINGS: There is underlying emphysematous change. There is minimal scarring in the bases. There is no edema or consolidation. Heart is upper normal in size. The pulmonary vascularity reflects underlying emphysema. No apparent adenopathy. No bone lesions.  IMPRESSION:  Underlying emphysematous change.  No edema or consolidation.   Electronically Signed   By: Bretta Bang M.D.   On: 10/10/2013 07:50    0900:  Pt states he feels "better" after meds and wants to go home now. Ambulated with Sats 94% R/A, denied SOB/CP. Offered another neb treatment, does not want another. Lungs coarse bilat, no wheezing, resps easy, speaking full sentences with ease, NAD. Pt has gotten himself dressed and is sitting at a chair at the bedside requesting to be discharged. Workup reassuring, will tx symptomatically. Dx and testing d/w pt and family.  Questions answered.  Verb understanding, agreeable to d/c home with outpt f/u.     Laray Anger, DO  10/13/13 0718 

## 2013-10-12 LAB — CULTURE, GROUP A STREP

## 2013-10-14 ENCOUNTER — Emergency Department (HOSPITAL_COMMUNITY): Payer: BC Managed Care – PPO

## 2013-10-14 ENCOUNTER — Emergency Department (HOSPITAL_COMMUNITY)
Admission: EM | Admit: 2013-10-14 | Discharge: 2013-10-14 | Disposition: A | Discharge status: Home / Self Care | Payer: BC Managed Care – PPO | Attending: Emergency Medicine | Admitting: Emergency Medicine

## 2013-10-14 ENCOUNTER — Encounter (HOSPITAL_COMMUNITY): Payer: Self-pay | Admitting: Emergency Medicine

## 2013-10-14 DIAGNOSIS — IMO0001 Reserved for inherently not codable concepts without codable children: Secondary | ICD-10-CM | POA: Insufficient documentation

## 2013-10-14 DIAGNOSIS — J441 Chronic obstructive pulmonary disease with (acute) exacerbation: Secondary | ICD-10-CM | POA: Insufficient documentation

## 2013-10-14 DIAGNOSIS — Z79899 Other long term (current) drug therapy: Secondary | ICD-10-CM | POA: Insufficient documentation

## 2013-10-14 DIAGNOSIS — Z792 Long term (current) use of antibiotics: Secondary | ICD-10-CM | POA: Insufficient documentation

## 2013-10-14 DIAGNOSIS — E785 Hyperlipidemia, unspecified: Secondary | ICD-10-CM | POA: Insufficient documentation

## 2013-10-14 DIAGNOSIS — Z7982 Long term (current) use of aspirin: Secondary | ICD-10-CM | POA: Insufficient documentation

## 2013-10-14 LAB — CBC WITH DIFFERENTIAL/PLATELET
BASOS ABS: 0.1 10*3/uL (ref 0.0–0.1)
Basophils Relative: 1 % (ref 0–1)
EOS ABS: 0.3 10*3/uL (ref 0.0–0.7)
EOS PCT: 4 % (ref 0–5)
HEMATOCRIT: 43.2 % (ref 39.0–52.0)
Hemoglobin: 14.9 g/dL (ref 13.0–17.0)
LYMPHS PCT: 26 % (ref 12–46)
Lymphs Abs: 2 10*3/uL (ref 0.7–4.0)
MCH: 31.2 pg (ref 26.0–34.0)
MCHC: 34.5 g/dL (ref 30.0–36.0)
MCV: 90.4 fL (ref 78.0–100.0)
MONO ABS: 0.6 10*3/uL (ref 0.1–1.0)
Monocytes Relative: 7 % (ref 3–12)
Neutro Abs: 4.7 10*3/uL (ref 1.7–7.7)
Neutrophils Relative %: 61 % (ref 43–77)
Platelets: 200 10*3/uL (ref 150–400)
RBC: 4.78 MIL/uL (ref 4.22–5.81)
RDW: 12.9 % (ref 11.5–15.5)
WBC: 7.7 10*3/uL (ref 4.0–10.5)

## 2013-10-14 LAB — BASIC METABOLIC PANEL
BUN: 14 mg/dL (ref 6–23)
CHLORIDE: 98 meq/L (ref 96–112)
CO2: 28 mEq/L (ref 19–32)
Calcium: 9.7 mg/dL (ref 8.4–10.5)
Creatinine, Ser: 0.91 mg/dL (ref 0.50–1.35)
GFR calc Af Amer: >90 mL/min (ref >90)
GFR calc non Af Amer: 89 mL/min — ABNORMAL LOW (ref >90)
Glucose, Bld: 119 mg/dL — ABNORMAL HIGH (ref 70–99)
Potassium: 4 mEq/L (ref 3.7–5.3)
Sodium: 140 mEq/L (ref 137–147)

## 2013-10-14 LAB — PRO B NATRIURETIC PEPTIDE: Pro B Natriuretic peptide (BNP): 66.7 pg/mL (ref 0–125)

## 2013-10-14 MED ORDER — ALBUTEROL SULFATE (2.5 MG/3ML) 0.083% IN NEBU
2.5000 mg | INHALATION_SOLUTION | Freq: Once | RESPIRATORY_TRACT | Status: AC
  Administered 2013-10-14: 2.5 mg via RESPIRATORY_TRACT
  Filled 2013-10-14: qty 3

## 2013-10-14 MED ORDER — IPRATROPIUM BROMIDE 0.02 % IN SOLN: 0.5000 mg | Freq: Once | RESPIRATORY_TRACT | Status: DC

## 2013-10-14 MED ORDER — IPRATROPIUM-ALBUTEROL 0.5-2.5 (3) MG/3ML IN SOLN
3.0000 mL | Freq: Once | RESPIRATORY_TRACT | Status: AC
  Administered 2013-10-14: 3 mL via RESPIRATORY_TRACT
  Filled 2013-10-14: qty 3

## 2013-10-14 MED ORDER — HYDROCOD POLST-CHLORPHEN POLST 10-8 MG/5ML PO LQCR
5.0000 mL | Freq: Once | ORAL | Status: AC
  Administered 2013-10-14: 5 mL via ORAL
  Filled 2013-10-14: qty 5

## 2013-10-14 MED ORDER — PREDNISONE 10 MG PO TABS: ORAL_TABLET | ORAL | Status: DC

## 2013-10-14 MED ORDER — ALBUTEROL SULFATE (2.5 MG/3ML) 0.083% IN NEBU: 5.0000 mg | INHALATION_SOLUTION | Freq: Once | RESPIRATORY_TRACT | Status: DC

## 2013-10-14 MED ORDER — HYDROCOD POLST-CHLORPHEN POLST 10-8 MG/5ML PO LQCR: 5.0000 mL | Freq: Two times a day (BID) | ORAL | Status: DC

## 2013-10-14 MED ORDER — PREDNISONE 50 MG PO TABS
60.0000 mg | ORAL_TABLET | Freq: Once | ORAL | Status: AC
  Administered 2013-10-14: 60 mg via ORAL
  Filled 2013-10-14 (×2): qty 1

## 2013-10-14 NOTE — ED Provider Notes (Signed)
CSN: [id number]     Arrival date & time 10/14/13  [id number] History   First MD Initiated Contact with Patient 10/14/13 [phone]     Chief Complaint  Patient presents with  . Cough   (Consider location/radiation/quality/duration/timing/severity/associated sxs/prior Treatment) Patient is a 63 y.o. male presenting with cough. The history is provided by the patient.  Cough Cough characteristics:  Non-productive Severity:  Moderate Onset quality:  Gradual Duration:  4 days Timing:  Constant Progression:  Worsening Chronicity:  Chronic Smoker: no   Context: upper respiratory infection   Relieved by:  Nothing Worsened by:  Activity Ineffective treatments:  Beta-agonist inhaler Associated symptoms: myalgias, shortness of breath, sinus congestion and wheezing   Associated symptoms: no chest pain, no diaphoresis, no eye discharge and no fever   Risk factors: recent infection   Risk factors: no recent travel     Past Medical History  Diagnosis Date  . Hyperlipidemia    History reviewed. No pertinent past surgical history. No family history on file. History  Substance Use Topics  . Smoking status: Never Smoker   . Smokeless tobacco: Not on file  . Alcohol Use: 0.6 oz/week    1 Glasses of wine per week     Comment: occasionally    Review of Systems  Constitutional: Negative for fever, diaphoresis and activity change.       All ROS Neg except as noted in HPI  HENT: Negative for nosebleeds.   Eyes: Negative for photophobia and discharge.  Respiratory: Positive for cough, shortness of breath and wheezing.   Cardiovascular: Negative for chest pain and palpitations.  Gastrointestinal: Negative for abdominal pain and blood in stool.  Genitourinary: Negative for dysuria, frequency and hematuria.  Musculoskeletal: Positive for myalgias. Negative for arthralgias, back pain and neck pain.  Skin: Negative.   Neurological: Negative for dizziness, seizures and speech difficulty.   Psychiatric/Behavioral: Negative for hallucinations and confusion.    Allergies  Cats claw  Home Medications   Current Outpatient Rx  Name  Route  Sig  Dispense  Refill  . albuterol (PROVENTIL HFA;VENTOLIN HFA) 108 (90 BASE) MCG/ACT inhaler   Inhalation   Inhale 2 puffs into the lungs every 4 (four) hours as needed for wheezing.   1 Inhaler   1   . aspirin EC 81 MG tablet   Oral   Take 81 mg by mouth daily.         Marland Kitchen. azithromycin (ZITHROMAX) 250 MG tablet      Take 2 tablets PO day 1, then 1 tab PO daily x4 days.   6 tablet   0   . GARLIC PO   Oral   Take 1 tablet by mouth daily.         . Multiple Vitamins-Minerals (MULTIVITAMINS THER. W/MINERALS) TABS tablet   Oral   Take 1 tablet by mouth daily.         . Omega-3 Fatty Acids (FISH OIL) 1000 MG CAPS   Oral   Take 1 capsule by mouth daily.          BP 149/75  Pulse 90  Temp(Src) 98.9 F (37.2 C) (Oral)  Resp 22  Ht 5\' 11"  (1.803 m)  Wt 250 lb (113.399 kg)  BMI 34.88 kg/m2  SpO2 96% Physical Exam  Nursing note and vitals reviewed. Constitutional: He is oriented to person, place, and time. He appears well-developed and well-nourished.  Non-toxic appearance.  HENT:  Head: Normocephalic.  Right Ear: Tympanic membrane and external ear normal.  Left Ear: Tympanic membrane and external ear normal.  Eyes: EOM and lids are normal. Pupils are equal, round, and reactive to light.  Neck: Normal range of motion. Neck supple. Carotid bruit is not present.  Cardiovascular: Normal rate, regular rhythm, normal heart sounds, intact distal pulses and normal pulses.   Pulmonary/Chest: No accessory muscle usage. No respiratory distress. He has wheezes. He has rhonchi.  Course wheezes and rhonchi throughout both lung fields. Symmetrical rise and fall of the chest. Patient speaks in complete sentences.  Abdominal: Soft. Bowel sounds are normal. There is no tenderness. There is no guarding.  Musculoskeletal: Normal  range of motion.  Lymphadenopathy:       Head (right side): No submandibular adenopathy present.       Head (left side): No submandibular adenopathy present.    He has no cervical adenopathy.  Neurological: He is alert and oriented to person, place, and time. He has normal strength. No cranial nerve deficit or sensory deficit.  Skin: Skin is warm and dry.  Psychiatric: He has a normal mood and affect. His speech is normal.    ED Course  Procedures (including critical care time) Labs Review Labs Reviewed  BASIC METABOLIC PANEL - Abnormal; Notable for the following:    Glucose, Bld 119 (*)    GFR calc non Af Amer 89 (*)    All other components within normal limits  PRO B NATRIURETIC PEPTIDE  CBC WITH DIFFERENTIAL   Imaging Review Dg Chest 2 View  10/14/2013   CLINICAL DATA:  Cough and congestion with femur and dyspnea  EXAM: CHEST  2 VIEW  COMPARISON:  DG CHEST 2 VIEW dated 10/10/2013; DG CHEST 2 VIEW dated 10/22/2005  FINDINGS: The lungs are mildly hyperinflated. The interstitial markings are mildly increased though stable. The cardiac silhouette is normal in size. The pulmonary vascularity is not engorged. There is no pleural effusion. The mediastinum is normal in width. The observed portions of the bony thorax appear normal.  IMPRESSION: 1. There is mild hyperinflation consistent with COPD. There is no evidence of pneumonia nor CHF. 2. Mildly increased interstitial markings bilaterally likely reflect an element of underlying pulmonary fibrosis. This is stable.   Electronically Signed   By: David  [place]   On: 10/14/2013 09:43    EKG Interpretation   None       MDM  No diagnosis found. **I have reviewed nursing notes, vital signs, and all appropriate lab and imaging results for this patient.* Chest x-ray reveals hyperinflation consistent with chronic obstructive pulmonary disease. There is increased interstitial markings bilaterally possibly reflect eating some pulmonary  fibrosis.  Patient was treated with 2 albuterol treatments here in the department with some improvement of his wheezing. Significant improvement in his air movement.  The patient states that he is not discussed his lung situation with his primary Dr. Algis Downs patient that there been some possible changes and scar tissue from a previous examination up until now. He agrees to discuss his lungs with his primary Dr. and developed a plan for the same. Prescription for prednisone taper and Tussionex given to the patient. The patient has an albuterol inhaler, patient's wife states they also have medication to use as a nebulizer if needed.   Kathie Dike, PA-C 10/14/13 1242

## 2013-10-14 NOTE — Discharge Instructions (Signed)
Your chest x-ray suggests chronic obstructive pulmonary disease. There's also some question of some pulmonary fibrosis present. Please discuss this with your primary physician. Please use albuterol 2 puffs every 4 hours until the more serious problem with the wheezing and shortness of breath has improved. Use prednisone taper as prescribed. Use Tussionex for cough and congestion. This medication may cause drowsiness, please use with caution. Chronic Obstructive Pulmonary Disease Exacerbation  Chronic obstructive pulmonary disease (COPD) is a common lung problem. In COPD, the flow of air from the lungs is limited. COPD exacerbations are times that breathing gets worse and you need extra treatment. Without treatment they can be life threatening. If they happen often, your lungs can become more damaged. HOME CARE  Do not smoke.  Avoid tobacco smoke and other things that bother your lungs.  If given, take your antibiotic medicine as told. Finish the medicine even if you start to feel better.  Only take medicines as told by your doctor.  Drink enough fluids to keep your pee (urine) clear or pale yellow (unless your doctor has told you not to).  Use a cool mist machine (vaporizer).  If you use oxygen or a machine that turns liquid medicine into a mist (nebulizer), continue to use them as told.  Keep up with shots (vaccinations) as told by your doctor.  Exercise regularly.  Eat healthy foods.  Keep all doctor visits as told. GET HELP RIGHT AWAY IF:  You are very short of breath and it gets worse.  You have trouble talking.  You have bad chest pain.  You have blood in your spit (sputum).  You have a fever.  You keep throwing up (vomiting).  You feel weak, or you pass out (faint).  You feel confused.  You keep getting worse. MAKE SURE YOU:   Understand these instructions.  Will watch your condition.  Will get help right away if you are not doing well or get worse. Document  Released: 08/24/2011 Document Revised: 06/25/2013 Document Reviewed: 05/09/2013 [facility] Patient Information 2014 [employer] [place].

## 2013-10-14 NOTE — ED Notes (Signed)
Pt. Reports coughing starting last Thursday. Pt. Reports coughing so hard that he is vomiting. Pt. Reports shortness of breath with exertion.

## 2013-10-16 NOTE — ED Provider Notes (Signed)
Medical screening examination/treatment/procedure(s) were performed by non-physician practitioner and as supervising physician I was immediately available for consultation/collaboration.  EKG Interpretation   None         Prosper Paff M Elianah Karis, DO 10/16/13 2133 

## 2016-02-09 ENCOUNTER — Emergency Department (HOSPITAL_COMMUNITY)
Admission: EM | Admit: 2016-02-09 | Discharge: 2016-02-09 | Disposition: A | Discharge status: Home / Self Care | Payer: BLUE CROSS/BLUE SHIELD | Attending: Emergency Medicine | Admitting: Emergency Medicine

## 2016-02-09 ENCOUNTER — Encounter (HOSPITAL_COMMUNITY): Payer: Self-pay | Admitting: Emergency Medicine

## 2016-02-09 DIAGNOSIS — Z79899 Other long term (current) drug therapy: Secondary | ICD-10-CM | POA: Diagnosis not present

## 2016-02-09 DIAGNOSIS — Z7982 Long term (current) use of aspirin: Secondary | ICD-10-CM | POA: Insufficient documentation

## 2016-02-09 DIAGNOSIS — R05 Cough: Secondary | ICD-10-CM | POA: Insufficient documentation

## 2016-02-09 DIAGNOSIS — I10 Essential (primary) hypertension: Secondary | ICD-10-CM | POA: Insufficient documentation

## 2016-02-09 DIAGNOSIS — R1110 Vomiting, unspecified: Secondary | ICD-10-CM | POA: Diagnosis present

## 2016-02-09 DIAGNOSIS — G4489 Other headache syndrome: Secondary | ICD-10-CM | POA: Diagnosis not present

## 2016-02-09 DIAGNOSIS — R0981 Nasal congestion: Secondary | ICD-10-CM | POA: Diagnosis not present

## 2016-02-09 DIAGNOSIS — R197 Diarrhea, unspecified: Secondary | ICD-10-CM | POA: Diagnosis not present

## 2016-02-09 DIAGNOSIS — R059 Cough, unspecified: Secondary | ICD-10-CM

## 2016-02-09 DIAGNOSIS — J449 Chronic obstructive pulmonary disease, unspecified: Secondary | ICD-10-CM | POA: Diagnosis not present

## 2016-02-09 DIAGNOSIS — E785 Hyperlipidemia, unspecified: Secondary | ICD-10-CM | POA: Diagnosis not present

## 2016-02-09 HISTORY — DX: Chronic obstructive pulmonary disease, unspecified: J44.9

## 2016-02-09 HISTORY — DX: Essential (primary) hypertension: I10

## 2016-02-09 LAB — LIPASE, BLOOD: LIPASE: 21 U/L (ref 11–51)

## 2016-02-09 LAB — CBC WITH DIFFERENTIAL/PLATELET
BASOS ABS: 0.1 10*3/uL (ref 0.0–0.1)
BASOS PCT: 1 %
EOS ABS: 0.1 10*3/uL (ref 0.0–0.7)
Eosinophils Relative: 1 %
HCT: 43.7 % (ref 39.0–52.0)
Hemoglobin: 15.2 g/dL (ref 13.0–17.0)
Lymphocytes Relative: 30 %
Lymphs Abs: 3 10*3/uL (ref 0.7–4.0)
MCH: 30.9 pg (ref 26.0–34.0)
MCHC: 34.8 g/dL (ref 30.0–36.0)
MCV: 88.8 fL (ref 78.0–100.0)
MONO ABS: 0.7 10*3/uL (ref 0.1–1.0)
Monocytes Relative: 7 %
Neutro Abs: 6 10*3/uL (ref 1.7–7.7)
Neutrophils Relative %: 61 %
PLATELETS: 154 10*3/uL (ref 150–400)
RBC: 4.92 MIL/uL (ref 4.22–5.81)
RDW: 13.1 % (ref 11.5–15.5)
WBC MORPHOLOGY: INCREASED
WBC: 9.9 10*3/uL (ref 4.0–10.5)

## 2016-02-09 LAB — BASIC METABOLIC PANEL
ANION GAP: 7 (ref 5–15)
BUN: 12 mg/dL (ref 6–20)
CO2: 26 mmol/L (ref 22–32)
CREATININE: 0.91 mg/dL (ref 0.61–1.24)
Calcium: 9.2 mg/dL (ref 8.9–10.3)
Chloride: 103 mmol/L (ref 101–111)
GFR calc non Af Amer: >60 mL/min (ref >60)
GLUCOSE: 116 mg/dL — AB (ref 65–99)
Potassium: 3.9 mmol/L (ref 3.5–5.1)
SODIUM: 136 mmol/L (ref 135–145)

## 2016-02-09 LAB — URINALYSIS, ROUTINE W REFLEX MICROSCOPIC
BILIRUBIN URINE: NEGATIVE
Glucose, UA: NEGATIVE mg/dL
Hgb urine dipstick: NEGATIVE
Ketones, ur: NEGATIVE mg/dL
Leukocytes, UA: NEGATIVE
Nitrite: NEGATIVE
PROTEIN: NEGATIVE mg/dL
Specific Gravity, Urine: 1.01 (ref 1.005–1.030)
pH: 6 (ref 5.0–8.0)

## 2016-02-09 LAB — HEPATIC FUNCTION PANEL
ALK PHOS: 71 U/L (ref 38–126)
ALT: 52 U/L (ref 17–63)
AST: 35 U/L (ref 15–41)
Albumin: 4.3 g/dL (ref 3.5–5.0)
BILIRUBIN DIRECT: 0.1 mg/dL (ref 0.1–0.5)
BILIRUBIN INDIRECT: 0.5 mg/dL (ref 0.3–0.9)
BILIRUBIN TOTAL: 0.6 mg/dL (ref 0.3–1.2)
Total Protein: 7.6 g/dL (ref 6.5–8.1)

## 2016-02-09 MED ORDER — ONDANSETRON 8 MG PO TBDP
8.0000 mg | ORAL_TABLET | Freq: Once | ORAL | Status: AC
  Administered 2016-02-09: 8 mg via ORAL
  Filled 2016-02-09: qty 1

## 2016-02-09 MED ORDER — ONDANSETRON 8 MG PO TBDP: ORAL_TABLET | ORAL | Status: DC

## 2016-02-09 MED ORDER — ACETAMINOPHEN 325 MG PO TABS
650.0000 mg | ORAL_TABLET | Freq: Once | ORAL | Status: AC
  Administered 2016-02-09: 650 mg via ORAL
  Filled 2016-02-09: qty 2

## 2016-02-09 NOTE — Discharge Instructions (Signed)

## 2016-02-09 NOTE — ED Notes (Signed)
Denies headache at this time, complains of  Left shoulder pain which is chronic.

## 2016-02-09 NOTE — ED Provider Notes (Signed)
CSN: [id number]     Arrival date & time 02/09/16  1146 History  By signing my name below, I, Iona Beard, attest that this documentation has been prepared under the direction and in the presence of Zadie Rhine, MD.   Electronically Signed: Iona Beard, ED Scribe. 02/09/2016. 12:32 PM   Chief Complaint  Patient presents with  . Emesis   Patient is a 65 y.o. male presenting with vomiting. The history is provided by the patient. No language interpreter was used.  Emesis Severity:  Moderate Duration:  1 day Timing:  Intermittent Quality:  Unable to specify Progression:  Unchanged Chronicity:  New Context: not post-tussive and not self-induced   Relieved by:  Nothing Worsened by:  Nothing tried Ineffective treatments:  None tried Associated symptoms: chills, diarrhea and headaches   Associated symptoms: no abdominal pain    HPI Comments: Greg Palmer is a 65 y.o. male who presents to the Emergency Department complaining of gradual onset, emesis, onset last night. Pt reports associated subjective fever, chills, mild headache, dry throat, diarrhea x3 episodes today, congestion, rhinorrhea, and cough. Pt notes he has pulled off several ticks recently with the last one being about 3 days ago. No other associated symptoms noted. No worsening or alleviating factors noted. Pt denies chest pain, abdominal pain, hematemesis, hemoptysis, rash, or any other pertinent symptoms. Pt does not smoke.   Past Medical History  Diagnosis Date  . Hyperlipidemia   . COPD (chronic obstructive pulmonary disease) (HCC)   . Hypertension    History reviewed. No pertinent past surgical history. History reviewed. No pertinent family history. Social History  Substance Use Topics  . Smoking status: Never Smoker   . Smokeless tobacco: None  . Alcohol Use: 0.6 oz/week    1 Glasses of wine per week     Comment: occasionally    Review of Systems  Constitutional: Positive for fever and chills.   HENT: Positive for congestion and rhinorrhea.   Respiratory: Positive for cough. Negative for shortness of breath.   Cardiovascular: Negative for chest pain.  Gastrointestinal: Positive for vomiting and diarrhea. Negative for abdominal pain.  Skin: Negative for rash.  Neurological: Positive for headaches.  All other systems reviewed and are negative.   Allergies  Cats claw  Home Medications   Prior to Admission medications   Medication Sig Start Date End Date Taking? Authorizing Provider  albuterol (PROVENTIL HFA;VENTOLIN HFA) 108 (90 BASE) MCG/ACT inhaler Inhale 2 puffs into the lungs every 4 (four) hours as needed for wheezing. 02/09/13  Yes Cathren Laine, MD  aspirin EC 81 MG tablet Take 81 mg by mouth daily.   Yes Historical Provider, MD  lisinopril (PRINIVIL,ZESTRIL) 5 MG tablet Take 1 tablet by mouth daily. 02/07/16  Yes Historical Provider, MD  Multiple Vitamins-Minerals (MULTIVITAMINS THER. W/MINERALS) TABS tablet Take 1 tablet by mouth daily.   Yes Historical Provider, MD  Omega-3 Fatty Acids (FISH OIL) 1000 MG CAPS Take 1 capsule by mouth daily.   Yes Historical Provider, MD   BP 164/86 mmHg  Pulse 85  Temp(Src) 98.8 F (37.1 C)  Resp 20  SpO2 98% Physical Exam CONSTITUTIONAL: Well developed/well nourished HEAD: Normocephalic/atraumatic EYES: EOMI/PERRL ENMT: Mucous membranes moist NECK: supple no meningeal signs SPINE/BACK:entire spine nontender CV: S1/S2 noted, no murmurs/rubs/gallops noted LUNGS: Lungs are clear to auscultation bilaterally, no apparent distress ABDOMEN: soft, nontender, no rebound or guarding, bowel sounds noted throughout abdomen GU:no cva tenderness NEURO: Pt is awake/alert/appropriate, moves all extremitiesx4.  No facial droop.  EXTREMITIES: pulses normal/equal, full ROM SKIN: warm, color normal, no rash noted PSYCH: no abnormalities of mood noted, alert and oriented to situation  ED Course  Procedures  DIAGNOSTIC STUDIES: Oxygen  Saturation is 98% on RA, normal by my interpretation.    COORDINATION OF CARE: 12:32 PM Discussed treatment plan which includes urinalysis, CBC with differential, and BMP with pt at bedside and pt agreed to plan.  Labs Review Labs Reviewed  BASIC METABOLIC PANEL - Abnormal; Notable for the following:    Glucose, Bld 116 (*)    All other components within normal limits  URINALYSIS, ROUTINE W REFLEX MICROSCOPIC (NOT AT [facility])  CBC WITH DIFFERENTIAL/PLATELET  HEPATIC FUNCTION PANEL  LIPASE, BLOOD   I have personally reviewed and evaluated these lab results as part of my medical decision-making.   Pt well appearing He is nontoxic He is taking PO fluids He reports vomiting/diarrhea/cough He reports recent ticks but no rash/fever to suggest tick borne illness Will d/c home Discussed strict return precautions  MDM   Final diagnoses:  Other headache syndrome  Vomiting and diarrhea  Cough   Nursing notes including past medical history and social history reviewed and considered in documentation Labs/vital reviewed myself and considered during evaluation   I personally performed the services described in this documentation, which was scribed in my presence. The recorded information has been reviewed and is accurate.      Zadie Rhineonald Merica Prell, MD 02/09/16 [phone]

## 2016-02-09 NOTE — ED Notes (Signed)
Patient with no complaints at this time. Respirations even and unlabored. Skin warm/dry. Discharge instructions reviewed with patient at this time. Patient given opportunity to voice concerns/ask questions. Patient discharged at this time and left Emergency Department with steady gait.   

## 2016-02-09 NOTE — ED Notes (Signed)
Pt c/o headache/congestion and n/v since yesterday. Pt reports pulling several ticks off of himself recently.

## 2016-02-09 NOTE — ED Notes (Signed)
Requesting medication for headache. MD consulted and order for Tylenol obtained.

## 2016-02-14 ENCOUNTER — Encounter (HOSPITAL_COMMUNITY): Payer: Self-pay | Admitting: Emergency Medicine

## 2016-02-14 ENCOUNTER — Inpatient Hospital Stay (HOSPITAL_COMMUNITY)
Admission: EM | Admit: 2016-02-14 | Discharge: 2016-02-17 | DRG: 190 | Disposition: A | Discharge status: Home / Self Care | Payer: BLUE CROSS/BLUE SHIELD | Attending: Internal Medicine | Admitting: Internal Medicine

## 2016-02-14 ENCOUNTER — Emergency Department (HOSPITAL_COMMUNITY): Payer: BLUE CROSS/BLUE SHIELD

## 2016-02-14 DIAGNOSIS — J189 Pneumonia, unspecified organism: Secondary | ICD-10-CM

## 2016-02-14 DIAGNOSIS — E785 Hyperlipidemia, unspecified: Secondary | ICD-10-CM | POA: Diagnosis present

## 2016-02-14 DIAGNOSIS — K5900 Constipation, unspecified: Secondary | ICD-10-CM | POA: Diagnosis present

## 2016-02-14 DIAGNOSIS — R7989 Other specified abnormal findings of blood chemistry: Secondary | ICD-10-CM | POA: Diagnosis present

## 2016-02-14 DIAGNOSIS — E1165 Type 2 diabetes mellitus with hyperglycemia: Secondary | ICD-10-CM | POA: Diagnosis present

## 2016-02-14 DIAGNOSIS — J9601 Acute respiratory failure with hypoxia: Secondary | ICD-10-CM | POA: Diagnosis present

## 2016-02-14 DIAGNOSIS — J209 Acute bronchitis, unspecified: Secondary | ICD-10-CM | POA: Diagnosis present

## 2016-02-14 DIAGNOSIS — Z7982 Long term (current) use of aspirin: Secondary | ICD-10-CM | POA: Diagnosis not present

## 2016-02-14 DIAGNOSIS — I10 Essential (primary) hypertension: Secondary | ICD-10-CM | POA: Diagnosis present

## 2016-02-14 DIAGNOSIS — T380X5A Adverse effect of glucocorticoids and synthetic analogues, initial encounter: Secondary | ICD-10-CM | POA: Diagnosis present

## 2016-02-14 DIAGNOSIS — J441 Chronic obstructive pulmonary disease with (acute) exacerbation: Secondary | ICD-10-CM | POA: Insufficient documentation

## 2016-02-14 DIAGNOSIS — J440 Chronic obstructive pulmonary disease with acute lower respiratory infection: Secondary | ICD-10-CM | POA: Diagnosis present

## 2016-02-14 DIAGNOSIS — R0602 Shortness of breath: Secondary | ICD-10-CM | POA: Diagnosis present

## 2016-02-14 DIAGNOSIS — I509 Heart failure, unspecified: Secondary | ICD-10-CM | POA: Diagnosis not present

## 2016-02-14 HISTORY — DX: Pain in unspecified shoulder: M25.519

## 2016-02-14 HISTORY — DX: Other chronic pain: G89.29

## 2016-02-14 LAB — COMPREHENSIVE METABOLIC PANEL
ALBUMIN: 3.6 g/dL (ref 3.5–5.0)
ALK PHOS: 70 U/L (ref 38–126)
ALT: 28 U/L (ref 17–63)
AST: 22 U/L (ref 15–41)
Anion gap: 8 (ref 5–15)
BUN: 19 mg/dL (ref 6–20)
CALCIUM: 8.4 mg/dL — AB (ref 8.9–10.3)
CO2: 25 mmol/L (ref 22–32)
Chloride: 101 mmol/L (ref 101–111)
Creatinine, Ser: 0.99 mg/dL (ref 0.61–1.24)
GFR calc Af Amer: >60 mL/min (ref >60)
GFR calc non Af Amer: >60 mL/min (ref >60)
GLUCOSE: 125 mg/dL — AB (ref 65–99)
Potassium: 4 mmol/L (ref 3.5–5.1)
Sodium: 134 mmol/L — ABNORMAL LOW (ref 135–145)
Total Bilirubin: 1.4 mg/dL — ABNORMAL HIGH (ref 0.3–1.2)
Total Protein: 7.7 g/dL (ref 6.5–8.1)

## 2016-02-14 LAB — CBC
HEMATOCRIT: 39 % (ref 39.0–52.0)
HEMOGLOBIN: 13.1 g/dL (ref 13.0–17.0)
MCH: 30.8 pg (ref 26.0–34.0)
MCHC: 33.6 g/dL (ref 30.0–36.0)
MCV: 91.8 fL (ref 78.0–100.0)
Platelets: 192 10*3/uL (ref 150–400)
RBC: 4.25 MIL/uL (ref 4.22–5.81)
RDW: 13.3 % (ref 11.5–15.5)
WBC: 15 10*3/uL — ABNORMAL HIGH (ref 4.0–10.5)

## 2016-02-14 LAB — BRAIN NATRIURETIC PEPTIDE: B Natriuretic Peptide: 141 pg/mL — ABNORMAL HIGH (ref 0.0–100.0)

## 2016-02-14 LAB — LIPASE, BLOOD: LIPASE: 19 U/L (ref 11–51)

## 2016-02-14 LAB — TROPONIN I: Troponin I: <0.03 ng/mL (ref <0.031)

## 2016-02-14 MED ORDER — LEVALBUTEROL HCL 0.63 MG/3ML IN NEBU
0.6300 mg | INHALATION_SOLUTION | Freq: Four times a day (QID) | RESPIRATORY_TRACT | Status: DC
  Administered 2016-02-15 – 2016-02-17 (×9): 0.63 mg via RESPIRATORY_TRACT
  Filled 2016-02-14 (×9): qty 3

## 2016-02-14 MED ORDER — SODIUM CHLORIDE 0.9 % IV SOLN
INTRAVENOUS | Status: DC
  Administered 2016-02-14: 20:00:00 via INTRAVENOUS

## 2016-02-14 MED ORDER — ONDANSETRON HCL 4 MG/2ML IJ SOLN: 4.0000 mg | Freq: Four times a day (QID) | INTRAMUSCULAR | Status: DC | PRN

## 2016-02-14 MED ORDER — DEXTROSE 5 % IV SOLN
1.0000 g | INTRAVENOUS | Status: DC
  Administered 2016-02-15 – 2016-02-16 (×2): 1 g via INTRAVENOUS
  Filled 2016-02-14 (×2): qty 10

## 2016-02-14 MED ORDER — DEXTROSE 5 % IV SOLN
1.0000 g | Freq: Once | INTRAVENOUS | Status: AC
  Administered 2016-02-14: 1 g via INTRAVENOUS
  Filled 2016-02-14: qty 10

## 2016-02-14 MED ORDER — IPRATROPIUM BROMIDE 0.02 % IN SOLN
1.0000 mg | Freq: Once | RESPIRATORY_TRACT | Status: AC
  Administered 2016-02-14: 1 mg via RESPIRATORY_TRACT
  Filled 2016-02-14: qty 5

## 2016-02-14 MED ORDER — OMEGA-3-ACID ETHYL ESTERS 1 G PO CAPS
1.0000 g | ORAL_CAPSULE | Freq: Two times a day (BID) | ORAL | Status: DC
  Administered 2016-02-14 – 2016-02-17 (×6): 1 g via ORAL
  Filled 2016-02-14 (×6): qty 1

## 2016-02-14 MED ORDER — ENOXAPARIN SODIUM 40 MG/0.4ML ~~LOC~~ SOLN
40.0000 mg | SUBCUTANEOUS | Status: DC
  Administered 2016-02-15 – 2016-02-16 (×2): 40 mg via SUBCUTANEOUS
  Filled 2016-02-14 (×2): qty 0.4

## 2016-02-14 MED ORDER — ASPIRIN EC 81 MG PO TBEC
81.0000 mg | DELAYED_RELEASE_TABLET | Freq: Every day | ORAL | Status: DC
  Administered 2016-02-14 – 2016-02-16 (×3): 81 mg via ORAL
  Filled 2016-02-14 (×3): qty 1

## 2016-02-14 MED ORDER — DEXTROSE 5 % IV SOLN
500.0000 mg | INTRAVENOUS | Status: DC
  Administered 2016-02-15 – 2016-02-16 (×2): 500 mg via INTRAVENOUS
  Filled 2016-02-14 (×2): qty 500

## 2016-02-14 MED ORDER — LEVALBUTEROL HCL 0.63 MG/3ML IN NEBU
0.6300 mg | INHALATION_SOLUTION | Freq: Once | RESPIRATORY_TRACT | Status: AC
  Administered 2016-02-14: 0.63 mg via RESPIRATORY_TRACT
  Filled 2016-02-14: qty 3

## 2016-02-14 MED ORDER — ALBUTEROL SULFATE (2.5 MG/3ML) 0.083% IN NEBU: 2.5000 mg | INHALATION_SOLUTION | RESPIRATORY_TRACT | Status: DC

## 2016-02-14 MED ORDER — ALBUTEROL (5 MG/ML) CONTINUOUS INHALATION SOLN
10.0000 mg/h | INHALATION_SOLUTION | Freq: Once | RESPIRATORY_TRACT | Status: AC
  Administered 2016-02-14: 10 mg/h via RESPIRATORY_TRACT
  Filled 2016-02-14: qty 20

## 2016-02-14 MED ORDER — METHYLPREDNISOLONE SODIUM SUCC 125 MG IJ SOLR
125.0000 mg | Freq: Once | INTRAMUSCULAR | Status: AC
  Administered 2016-02-14: 125 mg via INTRAVENOUS
  Filled 2016-02-14: qty 2

## 2016-02-14 MED ORDER — SODIUM CHLORIDE 0.9% FLUSH
3.0000 mL | Freq: Two times a day (BID) | INTRAVENOUS | Status: DC
  Administered 2016-02-15 – 2016-02-16 (×3): 3 mL via INTRAVENOUS

## 2016-02-14 MED ORDER — ONDANSETRON HCL 4 MG PO TABS: 4.0000 mg | ORAL_TABLET | Freq: Four times a day (QID) | ORAL | Status: DC | PRN

## 2016-02-14 MED ORDER — METHYLPREDNISOLONE SODIUM SUCC 40 MG IJ SOLR
40.0000 mg | Freq: Four times a day (QID) | INTRAMUSCULAR | Status: DC
  Administered 2016-02-15 – 2016-02-17 (×9): 40 mg via INTRAVENOUS
  Filled 2016-02-14 (×9): qty 1

## 2016-02-14 MED ORDER — AZITHROMYCIN 250 MG PO TABS
500.0000 mg | ORAL_TABLET | Freq: Once | ORAL | Status: AC
  Administered 2016-02-14: 500 mg via ORAL
  Filled 2016-02-14: qty 2

## 2016-02-14 MED ORDER — LISINOPRIL 5 MG PO TABS
5.0000 mg | ORAL_TABLET | Freq: Every day | ORAL | Status: DC
  Administered 2016-02-14 – 2016-02-16 (×3): 5 mg via ORAL
  Filled 2016-02-14 (×3): qty 1

## 2016-02-14 NOTE — ED Provider Notes (Signed)
CSN: [id number]     Arrival date & time 02/14/16  1729 History   First MD Initiated Contact with Patient 02/14/16 1910     Chief Complaint  Patient presents with  . Shortness of Breath  . Emesis     HPI  Pt was seen at 1920.  Per pt, c/o gradual onset and worsening of persistent cough, wheezing and SOB for the past 6 days.  Describes his symptoms as "my COPD is acting up."  Has been using home MDI without relief. Describes the cough as productive of "green-yellow" sputum. Has been associated with post-tussive emesis and runny/stuffy nose. Symptoms worsen with lying flat. Denies N/V without coughing. Denies CP/palpitations, no back pain, no abd pain, no diarrhea, no fevers, no rash. Pt was evaluated in the ED 5 days ago for his symptoms, states "I'm worse."   Past Medical History  Diagnosis Date  . Hyperlipidemia   . COPD (chronic obstructive pulmonary disease) (HCC)   . Hypertension   . Chronic shoulder pain    History reviewed. No pertinent past surgical history.  Social History  Substance Use Topics  . Smoking status: Never Smoker   . Smokeless tobacco: None  . Alcohol Use: 0.6 oz/week    1 Glasses of wine per week     Comment: occasionally    Review of Systems ROS: Statement: All systems negative except as marked or noted in the HPI; Constitutional: Negative for fever and chills. ; ; Eyes: Negative for eye pain, redness and discharge. ; ; ENMT: Negative for ear pain, hoarseness, sore throat. +nasal congestion, sinus pressure and rhinorrhea. ; ; Cardiovascular: Negative for chest pain, palpitations, diaphoresis, and peripheral edema. ; ; Respiratory: +cough, wheezing, SOB. Negative for stridor. ; ; Gastrointestinal: +post-tussive emesis. Negative for nausea/vomiting when not coughing. Negative for diarrhea, abdominal pain, blood in stool, hematemesis, jaundice and rectal bleeding. . ; ; Genitourinary: Negative for dysuria, flank pain and hematuria. ; ; Musculoskeletal: Negative for  back pain and neck pain. Negative for swelling and trauma.; ; Skin: Negative for pruritus, rash, abrasions, blisters, bruising and skin lesion.; ; Neuro: Negative for headache, lightheadedness and neck stiffness. Negative for weakness, altered level of consciousness, altered mental status, extremity weakness, paresthesias, involuntary movement, seizure and syncope.      Allergies  Cats claw  Home Medications   Prior to Admission medications   Medication Sig Start Date End Date Taking? Authorizing Provider  albuterol (PROVENTIL HFA;VENTOLIN HFA) 108 (90 BASE) MCG/ACT inhaler Inhale 2 puffs into the lungs every 4 (four) hours as needed for wheezing. 02/09/13  Yes Cathren LaineKevin Steinl, MD  aspirin EC 81 MG tablet Take 81 mg by mouth at bedtime.    Yes Historical Provider, MD  lisinopril (PRINIVIL,ZESTRIL) 5 MG tablet Take 1 tablet by mouth at bedtime.  02/07/16  Yes Historical Provider, MD  Multiple Vitamins-Minerals (MULTIVITAMINS THER. W/MINERALS) TABS tablet Take 1 tablet by mouth at bedtime.    Yes Historical Provider, MD  Omega-3 Fatty Acids (FISH OIL) 1000 MG CAPS Take 1 capsule by mouth at bedtime.    Yes Historical Provider, MD  ondansetron (ZOFRAN ODT) 8 MG disintegrating tablet 8mg  ODT q4 hours prn nausea Patient taking differently: 8 mg every 4 (four) hours as needed for nausea or vomiting. 8mg  ODT q4 hours prn nausea 02/09/16  Yes Zadie Rhineonald Wickline, MD   BP 137/67 mmHg  Pulse 92  Temp(Src) 98.4 F (36.9 C) (Oral)  Resp 22  Ht 5\' 11"  (1.803 m)  Wt 250 lb (  113.399 kg)  BMI 34.88 kg/m2  SpO2 96%   Patient Vitals for the past 24 hrs:  BP Temp Temp src Pulse Resp SpO2 Height Weight  02/14/16 2130 - - - (!) 124 24 93 % - -  02/14/16 2115 - - - 118 19 93 % - -  02/14/16 2100 129/76 mmHg - - 116 26 95 % - -  02/14/16 2045 - - - 108 (!) 34 98 % - -  02/14/16 2030 143/79 mmHg - - 103 19 99 % - -  02/14/16 2015 - - - 97 20 99 % - -  02/14/16 2008 136/85 mmHg - - 95 22 99 % - -  02/14/16 2000 -  - - 91 22 99 % - -  02/14/16 1945 - - - 88 14 97 % - -  02/14/16 1942 - - - - - 93 % - -  02/14/16 1936 114/90 mmHg - - 87 15 93 % - -  02/14/16 1930 114/90 mmHg - - 90 16 94 % - -  02/14/16 1735 137/67 mmHg 98.4 F (36.9 C) Oral 92 22 96 %  (1.803 m) 250 lb (113.399 kg)      Physical Exam 1925: Physical examination:  Nursing notes reviewed; Vital signs and O2 SAT reviewed;  Constitutional: Well developed, Well nourished, Well hydrated, Uncomfortable appearing.; Head:  Normocephalic, atraumatic; Eyes: EOMI, PERRL, No scleral icterus; ENMT: TM's clear bilat. +edemetous nasal turbinates bilat with clear rhinorrhea. Mouth and pharynx normal, Mucous membranes moist; Neck: Supple, Full range of motion, No lymphadenopathy; Cardiovascular: Tachycardic rate and rhythm, No gallop; Respiratory: Breath sounds diminished & equal bilaterally, faint insp/exp wheezes bilat. No audible wheezing. +moist cough during exam. Speaking short sentences, Sitting upright, tachypneic.; Chest: Nontender, Movement normal; Abdomen: Soft, Nontender, Nondistended, Normal bowel sounds; Genitourinary: No CVA tenderness; Extremities: Pulses normal, No tenderness, No edema, No calf edema or asymmetry.; Neuro: AA&Ox3, Major CN grossly intact.  Speech clear. No gross focal motor or sensory deficits in extremities.; Skin: Color normal, Warm, Dry.   ED Course  Procedures (including critical care time) Labs Review   Imaging Review  I have personally reviewed and evaluated these images and lab results as part of my medical decision-making.   EKG Interpretation   Date/Time:  Monday Feb 14 2016 17:37:58 EDT Ventricular Rate:  91 PR Interval:  150 QRS Duration: 90 QT Interval:  356 QTC Calculation: 437 R Axis:   -9 Text Interpretation:  Normal sinus rhythm Normal ECG Baseline wander When  compared with ECG of 10/10/2013 Rate slower Confirmed by [facility]  MD,  Nicholos Johns [phone]) on 02/14/2016 7:35:38 PM      MDM   MDM Reviewed: previous chart, nursing note and vitals Reviewed previous: labs and ECG Interpretation: labs, ECG and x-ray Total time providing critical care: 30-74 minutes. This excludes time spent performing separately reportable procedures and services. Consults: admitting MD   CRITICAL CARE Performed by: Laray Anger Total critical care time: 35 minutes Critical care time was exclusive of separately billable procedures and treating other patients. Critical care was necessary to treat or prevent imminent or life-threatening deterioration. Critical care was time spent personally by me on the following activities: development of treatment plan with patient and/or surrogate as well as nursing, discussions with consultants, evaluation of patient's response to treatment, examination of patient, obtaining history from patient or surrogate, ordering and performing treatments and interventions, ordering and review of laboratory studies, ordering and review of radiographic studies, pulse oximetry and re-evaluation  of patient's condition.   Results for orders placed or performed during the hospital encounter of 02/14/16  CBC  Result Value Ref Range   WBC 15.0 (H) 4.0 - 10.5 K/uL   RBC 4.25 4.22 - 5.81 MIL/uL   Hemoglobin 13.1 13.0 - 17.0 g/dL   HCT 40.9 81.1 - 91.4 %   MCV 91.8 78.0 - 100.0 fL   MCH 30.8 26.0 - 34.0 pg   MCHC 33.6 30.0 - 36.0 g/dL   RDW 78.2 95.6 - 21.3 %   Platelets 192 150 - 400 K/uL  Comprehensive metabolic panel  Result Value Ref Range   Sodium 134 (L) 135 - 145 mmol/L   Potassium 4.0 3.5 - 5.1 mmol/L   Chloride 101 101 - 111 mmol/L   CO2 25 22 - 32 mmol/L   Glucose, Bld 125 (H) 65 - 99 mg/dL   BUN 19 6 - 20 mg/dL   Creatinine, Ser 0.86 0.61 - 1.24 mg/dL   Calcium 8.4 (L) 8.9 - 10.3 mg/dL   Total Protein 7.7 6.5 - 8.1 g/dL   Albumin 3.6 3.5 - 5.0 g/dL   AST 22 15 - 41 U/L   ALT 28 17 - 63 U/L   Alkaline Phosphatase 70 38 - 126 U/L   Total Bilirubin 1.4  (H) 0.3 - 1.2 mg/dL   GFR calc non Af Amer >60 >60 mL/min   GFR calc Af Amer >60 >60 mL/min   Anion gap 8 5 - 15  Troponin I  Result Value Ref Range   Troponin I <0.03 <0.031 ng/mL  Brain natriuretic peptide (only with dyspnea)  Result Value Ref Range   B Natriuretic Peptide 141.0 (H) 0.0 - 100.0 pg/mL  Lipase, blood  Result Value Ref Range   Lipase 19 11 - 51 U/L   Dg Chest 2 View 02/14/2016  CLINICAL DATA:  Acute onset of cough and shortness of breath. Initial encounter. EXAM: CHEST  2 VIEW COMPARISON:  Chest radiograph performed 10/14/2013 FINDINGS: The lungs are well-aerated. Vascular congestion is noted. Mild left basilar opacity may reflect mild pneumonia or possibly mild asymmetric interstitial edema. There is no evidence of pleural effusion or pneumothorax. The heart is normal in size; the mediastinal contour is within normal limits. No acute osseous abnormalities are seen. IMPRESSION: Vascular congestion noted. Mild left basilar airspace opacity may reflect mild pneumonia or possibly mild asymmetric interstitial edema. Electronically Signed   By: Roanna Raider M.D.   On: 02/14/2016 18:22    2140:  On arrival: pt sitting upright, tachypneic, tachycardic, Sats 93-94 % R/A, lungs diminished. IV solumedrol and hour long neb started. After neb: pt appears more comfortable at rest, less tachypneic, Sats 97 % on R/A, lungs continue diminished. Pt ambulated in hallway: pt's O2 Sats dropped to 87 % R/A with pt c/o increasing SOB, with increasing HR and RR. Pt escorted back to stretcher with O2 Sats slowly increasing to 93-95 % on R/A. Will dose xopenex neb, IV abx for CAP, and admit. Dx and testing d/w pt and family.  Questions answered.  Verb understanding, agreeable to admit. T/C to [facility]. Conley Rolls, case discussed, including:  HPI, pertinent PM/SHx, VS/PE, dx testing, ED course and treatment:  Agreeable to admit, requests he will see pt in the ED.    Samuel Jester, DO 02/16/16 2107

## 2016-02-14 NOTE — ED Notes (Signed)
Pt states his symptoms from a few days ago have worsened.  States he is vomiting, coughing up green and yellow sputum.  Short of breath at times and cannot lie flat.

## 2016-02-14 NOTE — H&P (Signed)
History and Physical    Greg Palmer ZOX:[id number] DOB: [birth year] DOA: 02/14/2016  Referring MD/NP/PA: Samuel JesterKathleen McManus, DO PCP: Cassell SmilesFUSCO,LAWRENCE J., MD Outpatient Specialists:  Patient coming from: Home   Chief Complaint: Shortness of breath   HPI: Greg Palmer is a 65 y.o. male with medical history significant of COPD, HLD, and HLD presented with complaints of productive cough with green sputum, wheezing, and SOB for the past 6 days. Patient denies chest pain and recent tobacco use. No relief with OTC medications and noted he was seen five days ago in ED but reports that he has progressively worsened.  He was Tx with neb and continued to have wheezing, thus he was given IV steroids, and hospitalist was asked to admit him for COPD exacerbation with hypoxia.   ED Course: Sodium 134; Glucose 125; BNP 141; WBC 15. CXR noted.   Review of Systems: As per HPI otherwise 10 point review of systems negative.    Past Medical History  Diagnosis Date  . Hyperlipidemia   . COPD (chronic obstructive pulmonary disease) (HCC)   . Hypertension   . Chronic shoulder pain     History reviewed. No pertinent past surgical history.   reports that he has never smoked. He does not have any smokeless tobacco history on file. He reports that he drinks about 0.6 oz of alcohol per week. He reports that he uses illicit drugs (Marijuana).  Allergies  Allergen Reactions  . Cats Claw [Uncaria Tomentosa (Cats Claw)] Swelling and Rash    History reviewed. No pertinent family history. Unacceptable: Noncontributory, unremarkable, or negative. Acceptable: Family history reviewed and not pertinent (If you reviewed it)  Prior to Admission medications   Medication Sig Start Date End Date Taking? Authorizing Provider  albuterol (PROVENTIL HFA;VENTOLIN HFA) 108 (90 BASE) MCG/ACT inhaler Inhale 2 puffs into the lungs every 4 (four) hours as needed for wheezing. 02/09/13  Yes Cathren LaineKevin Steinl, MD  aspirin EC 81 MG  tablet Take 81 mg by mouth at bedtime.    Yes Historical Provider, MD  lisinopril (PRINIVIL,ZESTRIL) 5 MG tablet Take 1 tablet by mouth at bedtime.  02/07/16  Yes Historical Provider, MD  Multiple Vitamins-Minerals (MULTIVITAMINS THER. W/MINERALS) TABS tablet Take 1 tablet by mouth at bedtime.    Yes Historical Provider, MD  Omega-3 Fatty Acids (FISH OIL) 1000 MG CAPS Take 1 capsule by mouth at bedtime.    Yes Historical Provider, MD  ondansetron (ZOFRAN ODT) 8 MG disintegrating tablet 8mg  ODT q4 hours prn nausea Patient taking differently: 8 mg every 4 (four) hours as needed for nausea or vomiting. 8mg  ODT q4 hours prn nausea 02/09/16  Yes Zadie Rhineonald Wickline, MD    Physical Exam: Filed Vitals:   02/14/16 2045 02/14/16 2100 02/14/16 2115 02/14/16 2130  BP:  129/76    Pulse: 108 116 118 124  Temp:      TempSrc:      Resp: 34 26 19 24   Height:      Weight:      SpO2: 98% 95% 93% 93%      Constitutional: NAD, calm, comfortable Filed Vitals:   02/14/16 2045 02/14/16 2100 02/14/16 2115 02/14/16 2130  BP:  129/76    Pulse: 108 116 118 124  Temp:      TempSrc:      Resp: 34 26 19 24   Height:      Weight:      SpO2: 98% 95% 93% 93%   Respiratory:  Difuse inspiratory and expiratory wheezes.  Cardiovascular:  Regular rate and rhythm, no murmurs / rubs / gallops. No extremity edema. 2+ pedal pulses. No carotid bruits.  Abdomen: no tenderness, no masses palpated. No hepatosplenomegaly. Bowel sounds positive.  Musculoskeletal: no clubbing / cyanosis. No joint deformity upper and lower extremities. Good ROM, no contractures. Normal muscle tone.  Skin: no rashes, lesions, ulcers. No induration Neurologic: CN 2-12 grossly intact. Sensation intact, DTR normal. Strength 5/5 in all 4.  Psychiatric: Normal judgment and insight. Alert and oriented x 3. Normal mood.    Labs on Admission: I have personally reviewed following labs and imaging studies  CBC:  Recent Labs Lab 02/09/16 1228  02/14/16 2004  WBC 9.9 15.0*  NEUTROABS 6.0  --   HGB 15.2 13.1  HCT 43.7 39.0  MCV 88.8 91.8  PLT 154 192   Basic Metabolic Panel:  Recent Labs Lab 02/09/16 1228 02/14/16 2004  NA 136 134*  K 3.9 4.0  CL 103 101  CO2 26 25  GLUCOSE 116* 125*  BUN 12 19  CREATININE 0.91 0.99  CALCIUM 9.2 8.4*   Liver Function Tests:  Recent Labs Lab 02/09/16 1228 02/14/16 2004  AST 35 22  ALT 52 28  ALKPHOS 71 70  BILITOT 0.6 1.4*  PROT 7.6 7.7  ALBUMIN 4.3 3.6    Recent Labs Lab 02/09/16 1228 02/14/16 2004  LIPASE 21 19   Cardiac Enzymes:  Recent Labs Lab 02/14/16 2004  TROPONINI <0.03  Urine analysis:    Component Value Date/Time   COLORURINE YELLOW 02/09/2016 1205   APPEARANCEUR CLEAR 02/09/2016 1205   LABSPEC 1.010 02/09/2016 1205   PHURINE 6.0 02/09/2016 1205   GLUCOSEU NEGATIVE 02/09/2016 1205   HGBUR NEGATIVE 02/09/2016 1205   BILIRUBINUR NEGATIVE 02/09/2016 1205   KETONESUR NEGATIVE 02/09/2016 1205   PROTEINUR NEGATIVE 02/09/2016 1205   UROBILINOGEN 1.0 05/15/2012 0005   NITRITE NEGATIVE 02/09/2016 1205   LEUKOCYTESUR NEGATIVE 02/09/2016 1205   Sepsis Labs:   Radiological Exams on Admission: Dg Chest 2 View  02/14/2016  CLINICAL DATA:  Acute onset of cough and shortness of breath. Initial encounter. EXAM: CHEST  2 VIEW COMPARISON:  Chest radiograph performed 10/14/2013 FINDINGS: The lungs are well-aerated. Vascular congestion is noted. Mild left basilar opacity may reflect mild pneumonia or possibly mild asymmetric interstitial edema. There is no evidence of pleural effusion or pneumothorax. The heart is normal in size; the mediastinal contour is within normal limits. No acute osseous abnormalities are seen. IMPRESSION: Vascular congestion noted. Mild left basilar airspace opacity may reflect mild pneumonia or possibly mild asymmetric interstitial edema. Electronically Signed   By: Roanna Raider M.D.   On: 02/14/2016 18:22     Assessment/Plan  1. COPD exacerbation, with moderate wheezing. Started on IV steroids, rocephin and Zithromax.  Continue nebs and breathing treatments. WBC mildly elevated. Provided supplemental oxygen PRN.  He is a little tachycardic, so will change to Xopenex nebs.  2. HLD. Continue statin.  3. Essential HTN. Stable. Continue home medcations.    DVT prophylaxis: Lovenox Code Status: Full Family Communication: Wife bedside Disposition Plan: Anticipate discharge home in 2-3 days.  Consults called:  Admission status:  Inpatient   Houston Siren, MD FACP.  [facility]  If 7PM-7AM, please contact night-coverage [email or web address] Password [facility]  02/14/2016, 9:44 PM    By signing my name below, I, Zadie Cleverly, attest that this documentation has been prepared under the direction and in the presence of Houston Siren, MD. Electronically signed: Zadie Cleverly, Scribe. 02/14/2016 9:42pm   I,  Dr. Houston Siren, personally performed the services described in this documentation. All medical record entries made by the scribe were at my discretion and in my presence. Houston Siren, MD 02/14/2016

## 2016-02-15 ENCOUNTER — Inpatient Hospital Stay (HOSPITAL_COMMUNITY): Payer: BLUE CROSS/BLUE SHIELD

## 2016-02-15 DIAGNOSIS — I10 Essential (primary) hypertension: Secondary | ICD-10-CM

## 2016-02-15 DIAGNOSIS — J9601 Acute respiratory failure with hypoxia: Secondary | ICD-10-CM | POA: Diagnosis present

## 2016-02-15 DIAGNOSIS — J209 Acute bronchitis, unspecified: Secondary | ICD-10-CM | POA: Diagnosis present

## 2016-02-15 DIAGNOSIS — K5900 Constipation, unspecified: Secondary | ICD-10-CM | POA: Diagnosis present

## 2016-02-15 DIAGNOSIS — I509 Heart failure, unspecified: Secondary | ICD-10-CM

## 2016-02-15 DIAGNOSIS — R7989 Other specified abnormal findings of blood chemistry: Secondary | ICD-10-CM | POA: Diagnosis present

## 2016-02-15 DIAGNOSIS — R799 Abnormal finding of blood chemistry, unspecified: Secondary | ICD-10-CM

## 2016-02-15 LAB — BASIC METABOLIC PANEL
Anion gap: 8 (ref 5–15)
BUN: 17 mg/dL (ref 6–20)
CHLORIDE: 103 mmol/L (ref 101–111)
CO2: 25 mmol/L (ref 22–32)
Calcium: 8.8 mg/dL — ABNORMAL LOW (ref 8.9–10.3)
Creatinine, Ser: 0.95 mg/dL (ref 0.61–1.24)
GFR calc Af Amer: >60 mL/min (ref >60)
GFR calc non Af Amer: >60 mL/min (ref >60)
GLUCOSE: 320 mg/dL — AB (ref 65–99)
POTASSIUM: 4.1 mmol/L (ref 3.5–5.1)
Sodium: 136 mmol/L (ref 135–145)

## 2016-02-15 LAB — ECHOCARDIOGRAM COMPLETE
HEIGHTINCHES: 71 in
Weight: 4000 oz

## 2016-02-15 LAB — GLUCOSE, CAPILLARY
GLUCOSE-CAPILLARY: 307 mg/dL — AB (ref 65–99)
Glucose-Capillary: 382 mg/dL — ABNORMAL HIGH (ref 65–99)
Glucose-Capillary: 432 mg/dL — ABNORMAL HIGH (ref 65–99)
Glucose-Capillary: 473 mg/dL — ABNORMAL HIGH (ref 65–99)

## 2016-02-15 LAB — CBC
HEMATOCRIT: 40.5 % (ref 39.0–52.0)
Hemoglobin: 13.4 g/dL (ref 13.0–17.0)
MCH: 30.4 pg (ref 26.0–34.0)
MCHC: 33.1 g/dL (ref 30.0–36.0)
MCV: 91.8 fL (ref 78.0–100.0)
Platelets: 202 10*3/uL (ref 150–400)
RBC: 4.41 MIL/uL (ref 4.22–5.81)
RDW: 13.2 % (ref 11.5–15.5)
WBC: 13 10*3/uL — ABNORMAL HIGH (ref 4.0–10.5)

## 2016-02-15 LAB — STREP PNEUMONIAE URINARY ANTIGEN: Strep Pneumo Urinary Antigen: NEGATIVE

## 2016-02-15 LAB — INFLUENZA PANEL BY PCR (TYPE A & B)
H1N1 flu by pcr: NOT DETECTED
INFLBPCR: NEGATIVE
Influenza A By PCR: NEGATIVE

## 2016-02-15 LAB — GLUCOSE, RANDOM: GLUCOSE: 472 mg/dL — AB (ref 65–99)

## 2016-02-15 MED ORDER — BENZONATATE 100 MG PO CAPS
100.0000 mg | ORAL_CAPSULE | Freq: Three times a day (TID) | ORAL | Status: DC
  Administered 2016-02-15 – 2016-02-17 (×7): 100 mg via ORAL
  Filled 2016-02-15 (×7): qty 1

## 2016-02-15 MED ORDER — ACETAMINOPHEN 325 MG PO TABS: 650.0000 mg | ORAL_TABLET | Freq: Four times a day (QID) | ORAL | Status: DC | PRN

## 2016-02-15 MED ORDER — POLYETHYLENE GLYCOL 3350 17 G PO PACK
17.0000 g | PACK | Freq: Every day | ORAL | Status: DC
  Administered 2016-02-15 – 2016-02-17 (×3): 17 g via ORAL
  Filled 2016-02-15 (×3): qty 1

## 2016-02-15 MED ORDER — INSULIN ASPART 100 UNIT/ML ~~LOC~~ SOLN
8.0000 [IU] | Freq: Once | SUBCUTANEOUS | Status: AC
  Administered 2016-02-15: 8 [IU] via SUBCUTANEOUS

## 2016-02-15 MED ORDER — INSULIN ASPART 100 UNIT/ML ~~LOC~~ SOLN
0.0000 [IU] | Freq: Three times a day (TID) | SUBCUTANEOUS | Status: DC
  Administered 2016-02-15: 9 [IU] via SUBCUTANEOUS
  Administered 2016-02-15 – 2016-02-16 (×3): 7 [IU] via SUBCUTANEOUS
  Administered 2016-02-16: 8 [IU] via SUBCUTANEOUS
  Administered 2016-02-17: 3 [IU] via SUBCUTANEOUS

## 2016-02-15 MED ORDER — GUAIFENESIN ER 600 MG PO TB12
600.0000 mg | ORAL_TABLET | Freq: Two times a day (BID) | ORAL | Status: DC
  Administered 2016-02-15 – 2016-02-17 (×5): 600 mg via ORAL
  Filled 2016-02-15 (×5): qty 1

## 2016-02-15 MED ORDER — SENNOSIDES-DOCUSATE SODIUM 8.6-50 MG PO TABS
1.0000 | ORAL_TABLET | Freq: Every day | ORAL | Status: DC
  Administered 2016-02-15 – 2016-02-16 (×2): 1 via ORAL
  Filled 2016-02-15 (×2): qty 1

## 2016-02-15 NOTE — Progress Notes (Signed)
[facility] Progress Note  Patient: Greg Palmer ZOX:[id number]   PCP: Cassell Smiles., MD DOB: 12-09-1950   DOA: 02/14/2016   DOS: 02/15/2016   Date of Service: the patient was seen and examined on 02/15/2016  Subjective: Patient mentions he is feeling better continues to have cough with expectoration, also continues to have shortness of breath. No chest pain or abdominal pain no nausea no vomiting. Appetite is improving. Patient denies any exposure to chemicals, denies any active smoking, quit smoking 40 years ago. Uses marijuana once every day. Denies any IV drug abuse or any other drug abuse. Has a dog and a parakeet at home no other exposure. No frequent respiratory infection. Nutrition: Tolerating oral diet  Brief hospital course: Patient was admitted on 02/14/2016, with complaint of shortness of breath and cough, was found to have COPD exacerbation likely due to acute bronchitis. Currently further plan is continue nebulizers as IV steroids.  Assessment and Plan: 1. Acute respiratory failure with hypoxemia (HCC) Due to COPD exacerbation. From acute bronchitis. Chest x-ray shows possible left basilar airspace disease suggesting pneumonia. Also has vascular congestion on chest x-ray.  Discontinue IV fluids. Continue IV Solu Medrol 40 mg every 6 hours. Continue nebulizers scheduled. Continue ceftriaxone and azithromycin. Check influenza PCR, sputum culture, urine antigen. Add Mucinex and flutter valve. Tessalon Perles scheduled for cough.  2. Essential hypertension. Continuing lisinopril. Continuing aspirin.  3. Hyperglycemia. Sugars under 300 this morning. No prior history of diabetes mellitus. We will place on sliding scale insulin. Likely due to steroids.  4. Vascular congestion on chest x-ray. Bilateral crackles. Trace pedal edema. BNP 141. EKG unremarkable Check echocardiogram. Likely diastolic dysfunction Discontinue fluids  5. Constipation. Add MiraLAX  as well as Senokot    Pain management: When necessary Tylenol Activity: Currently independent  Bowel regimen: last BM prior to admission. Diet: Heart healthy diet DVT Prophylaxis: subcutaneous Heparin  Advance goals of care discussion: Full code  Family Communication: no family was present at bedside, at the time of interview.   Disposition:  Discharge to home Expected discharge date: 02/20/2016 pending improvement in oxygenation  Consultants: none Procedures: ECHO pending   Antibiotics: Anti-infectives    Start     Dose/Rate Route Frequency Ordered Stop   02/15/16 2200  cefTRIAXone (ROCEPHIN) 1 g in dextrose 5 % 50 mL IVPB     1 g 100 mL/hr over 30 Minutes Intravenous Every 24 hours 02/14/16 2337     02/15/16 2100  azithromycin (ZITHROMAX) 500 mg in dextrose 5 % 250 mL IVPB     500 mg 250 mL/hr over 60 Minutes Intravenous Every 24 hours 02/14/16 2337     02/14/16 2145  cefTRIAXone (ROCEPHIN) 1 g in dextrose 5 % 50 mL IVPB     1 g 100 mL/hr over 30 Minutes Intravenous  Once 02/14/16 2131 02/14/16 2313   02/14/16 2145  azithromycin (ZITHROMAX) tablet 500 mg     500 mg Oral  Once 02/14/16 2131 02/14/16 2221       No intake or output data in the 24 hours ending 02/15/16 0837 Filed Weights   02/14/16 1735  Weight: 113.399 kg (250 lb)    Objective: Physical Exam: Filed Vitals:   02/14/16 2345 02/15/16 0116 02/15/16 0532 02/15/16 0737  BP: 135/71  124/78   Pulse: 116  103   Temp: 99.7 F (37.6 C)  97.9 F (36.6 C)   TempSrc: Oral  Oral   Resp: 20  20   Height:  Weight:      SpO2: 93% 96% 96% 93%    General: Alert, Awake and Oriented to Time, Place and Person. Appear in moderate distress Eyes: PERRL, Conjunctiva normal ENT: Oral Mucosa clear moist. Neck: no JVD, no Abnormal Mass Or lumps Cardiovascular: S1 and S2 Present, no Murmur, Peripheral Pulses Present Respiratory: Bilateral Air entry equal and Decreased, bilateral Crackles, bilateral  wheezes Abdomen: Bowel Sound present, Soft and no tenderness Skin: no redness, no Rash  Extremities: trace Pedal edema, no calf tenderness Neurologic: Grossly no focal neuro deficit. Bilaterally Equal motor strength  Data Reviewed: CBC:  Recent Labs Lab 02/09/16 1228 02/14/16 2004 02/15/16 0500  WBC 9.9 15.0* 13.0*  NEUTROABS 6.0  --   --   HGB 15.2 13.1 13.4  HCT 43.7 39.0 40.5  MCV 88.8 91.8 91.8  PLT 154 192 202   Basic Metabolic Panel:  Recent Labs Lab 02/09/16 1228 02/14/16 2004 02/15/16 0500  NA 136 134* 136  K 3.9 4.0 4.1  CL 103 101 103  CO2 26 25 25   GLUCOSE 116* 125* 320*  BUN 12 19 17   CREATININE 0.91 0.99 0.95  CALCIUM 9.2 8.4* 8.8*    Liver Function Tests:  Recent Labs Lab 02/09/16 1228 02/14/16 2004  AST 35 22  ALT 52 28  ALKPHOS 71 70  BILITOT 0.6 1.4*  PROT 7.6 7.7  ALBUMIN 4.3 3.6    Recent Labs Lab 02/09/16 1228 02/14/16 2004  LIPASE 21 19   No results for input(s): AMMONIA in the last 168 hours. Coagulation Profile: No results for input(s): INR, PROTIME in the last 168 hours. Cardiac Enzymes:  Recent Labs Lab 02/14/16 2004  TROPONINI <0.03   BNP (last 3 results) No results for input(s): PROBNP in the last 8760 hours.  CBG: No results for input(s): GLUCAP in the last 168 hours.  Studies: Dg Chest 2 View  02/14/2016  CLINICAL DATA:  Acute onset of cough and shortness of breath. Initial encounter. EXAM: CHEST  2 VIEW COMPARISON:  Chest radiograph performed 10/14/2013 FINDINGS: The lungs are well-aerated. Vascular congestion is noted. Mild left basilar opacity may reflect mild pneumonia or possibly mild asymmetric interstitial edema. There is no evidence of pleural effusion or pneumothorax. The heart is normal in size; the mediastinal contour is within normal limits. No acute osseous abnormalities are seen. IMPRESSION: Vascular congestion noted. Mild left basilar airspace opacity may reflect mild pneumonia or possibly mild  asymmetric interstitial edema. Electronically Signed   By: Roanna RaiderJeffery  Chang M.D.   On: 02/14/2016 18:22     Scheduled Meds: . aspirin EC  81 mg Oral QHS  . azithromycin  500 mg Intravenous Q24H  . benzonatate  100 mg Oral TID  . cefTRIAXone (ROCEPHIN)  IV  1 g Intravenous Q24H  . enoxaparin (LOVENOX) injection  40 mg Subcutaneous Q24H  . guaiFENesin  600 mg Oral BID  . insulin aspart  0-9 Units Subcutaneous TID WC  . levalbuterol  0.63 mg Nebulization Q6H  . lisinopril  5 mg Oral QHS  . methylPREDNISolone (SOLU-MEDROL) injection  40 mg Intravenous Q6H  . omega-3 acid ethyl esters  1 g Oral BID  . polyethylene glycol  17 g Oral Daily  . senna-docusate  1 tablet Oral QHS  . sodium chloride flush  3 mL Intravenous Q12H   Continuous Infusions:  PRN Meds: acetaminophen, ondansetron **OR** ondansetron (ZOFRAN) IV  Time spent: 30 minutes  Author: Lynden OxfordPranav Patel, MD [facility] Pager: [phone] 02/15/2016 8:37 AM  If  7PM-7AM, please contact night-coverage at [email or web address], password [facility]

## 2016-02-15 NOTE — Progress Notes (Signed)
CBG was 473.  Stat blood glucose was obtained and resulted as 472.  On call MD notified and new orders received and carried out.  Will continue to monitor.

## 2016-02-15 NOTE — Care Management Note (Signed)
Case Management Note  Patient Details  Name: Willow OraMichael Westbay MRN: [id number] Date of Birth: [birth year]  Subjective/Objective: Patient is from home, Reports no issues with getting to appointment or obtaining meds. Has not had any HH services in the past, none anticipated at DC at this time. Pt is currently on oxygen but being weaned off.                  Action/Plan: No CM needs at this time, wil cont. To follow.    Expected Discharge Date:      02/15/2016            Expected Discharge Plan:  Home/Self Care  In-House Referral:     Discharge planning Services  CM Consult  Post Acute Care Choice:  NA Choice offered to:  NA  DME Arranged:    DME Agency:     HH Arranged:    HH Agency:     Status of Service:  Completed, signed off  Medicare Important Message Given:    Date Medicare IM Given:    Medicare IM give by:    Date Additional Medicare IM Given:    Additional Medicare Important Message give by:     If discussed at Long Length of Stay Meetings, dates discussed:    Additional Comments:  Bertrum Helmstetter, Chrystine OilerSharley Diane, RN 02/15/2016, 9:24 AM

## 2016-02-16 DIAGNOSIS — J9601 Acute respiratory failure with hypoxia: Secondary | ICD-10-CM

## 2016-02-16 LAB — BASIC METABOLIC PANEL
Anion gap: 8 (ref 5–15)
BUN: 22 mg/dL — AB (ref 6–20)
CO2: 24 mmol/L (ref 22–32)
CREATININE: 0.9 mg/dL (ref 0.61–1.24)
Calcium: 8.9 mg/dL (ref 8.9–10.3)
Chloride: 104 mmol/L (ref 101–111)
GFR calc Af Amer: >60 mL/min (ref >60)
Glucose, Bld: 376 mg/dL — ABNORMAL HIGH (ref 65–99)
Potassium: 3.9 mmol/L (ref 3.5–5.1)
SODIUM: 136 mmol/L (ref 135–145)

## 2016-02-16 LAB — CBC
HCT: 37.8 % — ABNORMAL LOW (ref 39.0–52.0)
Hemoglobin: 12.8 g/dL — ABNORMAL LOW (ref 13.0–17.0)
MCH: 30.4 pg (ref 26.0–34.0)
MCHC: 33.9 g/dL (ref 30.0–36.0)
MCV: 89.8 fL (ref 78.0–100.0)
PLATELETS: 261 10*3/uL (ref 150–400)
RBC: 4.21 MIL/uL — ABNORMAL LOW (ref 4.22–5.81)
RDW: 13 % (ref 11.5–15.5)
WBC: 15 10*3/uL — AB (ref 4.0–10.5)

## 2016-02-16 LAB — GLUCOSE, CAPILLARY
GLUCOSE-CAPILLARY: 317 mg/dL — AB (ref 65–99)
GLUCOSE-CAPILLARY: 303 mg/dL — AB (ref 65–99)
Glucose-Capillary: 306 mg/dL — ABNORMAL HIGH (ref 65–99)
Glucose-Capillary: 288 mg/dL — ABNORMAL HIGH (ref 65–99)

## 2016-02-16 MED ORDER — INSULIN GLARGINE 100 UNIT/ML ~~LOC~~ SOLN
8.0000 [IU] | Freq: Every day | SUBCUTANEOUS | Status: DC
  Administered 2016-02-16 – 2016-02-17 (×2): 8 [IU] via SUBCUTANEOUS
  Filled 2016-02-16 (×2): qty 0.08

## 2016-02-16 MED ORDER — ZOLPIDEM TARTRATE 5 MG PO TABS
5.0000 mg | ORAL_TABLET | Freq: Every evening | ORAL | Status: DC | PRN
  Administered 2016-02-16: 5 mg via ORAL
  Filled 2016-02-16: qty 1

## 2016-02-16 NOTE — Progress Notes (Signed)
Inpatient Diabetes Program Recommendations  AACE/ADA: New Consensus Statement on Inpatient Glycemic Control (2015)  Target Ranges:  Prepandial:   less than 140 mg/dL      Peak postprandial:   less than 180 mg/dL (1-2 hours)      Critically ill patients:  140 - 180 mg/dL  Results for Greg Palmer, Greg Palmer (MRN [id number]) as of 02/16/2016 08:47  Ref. Range 02/15/2016 11:32 02/15/2016 17:03 02/15/2016 20:44 02/15/2016 23:42 02/16/2016 07:25  Glucose-Capillary Latest Ref Range: 65-99 mg/dL 086382 (H) 578307 (H) 469473 (H) 432 (H) 306 (H)   Review of Glycemic Control  Diabetes history: No Outpatient Diabetes medications: NA Current orders for Inpatient glycemic control: Novolog 0-9 units TID with meals  Inpatient Diabetes Program Recommendations: Insulin - Basal: If steroids are continued, please consider ordering Lantus 11 units daily (based on 113 kg x 0.1 unit). Please note that if Lantus is ordered as recommended, once steroids are tapered basal insulin will also likely need to be adjusted. Correction (SSI): Please consider ordering Novolog bedtime correction scale. Insulin - Meal Coverage: If steroids are continued, please consider ordering Novolog 5 units TID with meals for meal coverage. A1C: In process.  Thanks, Orlando PennerMarie Madolin Twaddle, RN, MSN, CDE Diabetes Coordinator Inpatient Diabetes Program [phone] (Team Pager from 8am to 5pm) [phone] ([facility] office) [phone] [facility] office) [phone] [facility] office)

## 2016-02-16 NOTE — Progress Notes (Signed)
PROGRESS NOTE  Greg Palmer [id number] DOB: 1951/06/20 DOA: 02/14/2016 PCP: Cassell SmilesFUSCO,LAWRENCE J., MD   LOS: 2 days   Brief Narrative: Patient was admitted on 02/14/2016, with complaint of shortness of breath and cough, was found to have COPD exacerbation likely due to acute bronchitis.   Assessment & Plan: Principal Problem:   Acute respiratory failure with hypoxemia (HCC) Active Problems:   COPD with exacerbation (HCC)   HLD (hyperlipidemia)   HTN (hypertension)   Acute bronchitis   Elevated brain natriuretic peptide (BNP) level   Constipation   COPD exacerbation - Continue antibiotics, continue steroids and breathing treatments. Keep IV steroids for today - Improving - Continue guaifenesin, Tessalon  Acute hypoxic respiratory failure - Due to #1, wean off oxygen as tolerated. He is not on chronic oxygen at home.  Hypertension - Continue home medications  Hyperglycemia - Likely due to steroids, no diagnosis of diabetes in the past. - Hemoglobin A1c is pending   DVT prophylaxis: lovenox Code Status: Full Family Communication: no family bedside Disposition Plan: home when ready  Consultants:   None   Procedures:  2D echo: Study Conclusions - Left ventricle: The cavity size was normal. Wall thickness was increased in a pattern of moderate LVH. Systolic function was vigorous. The estimated ejection fraction was in the range of 65% to 70%. Fusion of E/A waves in setting of tachycardia, cannot distinguish diastolic function. Aortic valve: Moderately calcified annulus. Trileaflet; mildly thickened leaflets. Valve area (VTI): 3.27 cm^2. Valve area (Vmax): 3.27 cm^2.  Antimicrobials:  Ceftriaxone 5/30 >>  Azithromycin 5/30   Subjective: - Breathing is improved, complaints of shortness of breath when he ambulate in the room. No chest pain, no abdominal pain, no nausea or vomiting. Concerned about hyperglycemia and possibility that he has  diabetes.  Objective: Filed Vitals:   02/15/16 2039 02/16/16 0101 02/16/16 0645 02/16/16 0824  BP: 157/85  149/86   Pulse: 112  94   Temp: 98.3 F (36.8 C)  98.3 F (36.8 C)   TempSrc: Oral  Oral   Resp: 20  19   Height:      Weight:      SpO2: 95% 96% 96% 91%    Intake/Output Summary (Last 24 hours) at 02/16/16 0848 Last data filed at 02/15/16 2300  Gross per 24 hour  Intake    720 ml  Output    300 ml  Net    420 ml   Filed Weights   02/14/16 1735  Weight: 113.399 kg (250 lb)    Examination: Constitutional: NAD Filed Vitals:   02/15/16 2039 02/16/16 0101 02/16/16 0645 02/16/16 0824  BP: 157/85  149/86   Pulse: 112  94   Temp: 98.3 F (36.8 C)  98.3 F (36.8 C)   TempSrc: Oral  Oral   Resp: 20  19   Height:      Weight:      SpO2: 95% 96% 96% 91%   Eyes: PERRL, lids and conjunctivae normal ENMT: Mucous membranes are moist. No oropharyngeal exudates Neck: normal, supple, no masses, no thyromegaly Respiratory: + wheezing, no crackles. Normal respiratory effort. No accessory muscle use.  Cardiovascular: Regular rate and rhythm, no murmurs / rubs / gallops. No LE edema. 2+ pedal pulses. No carotid bruits.  Abdomen: no tenderness. Bowel sounds positive.  Musculoskeletal: no clubbing / cyanosis. No joint deformity upper and lower extremities. No contractures. Normal muscle tone.  Skin: no rashes, lesions, ulcers. No induration   Data Reviewed: I have personally reviewed  following labs and imaging studies  CBC:  Recent Labs Lab 02/09/16 1228 02/14/16 2004 02/15/16 0500 02/16/16 0547  WBC 9.9 15.0* 13.0* 15.0*  NEUTROABS 6.0  --   --   --   HGB 15.2 13.1 13.4 12.8*  HCT 43.7 39.0 40.5 37.8*  MCV 88.8 91.8 91.8 89.8  PLT 154 192 202 261   Basic Metabolic Panel:  Recent Labs Lab 02/09/16 1228 02/14/16 2004 02/15/16 0500 02/15/16 2146 02/16/16 0547  NA 136 134* 136  --  136  K 3.9 4.0 4.1  --  3.9  CL 103 101 103  --  104  CO2 --  24   GLUCOSE 116* 125* 320* 472* 376*  BUN --  22*  CREATININE 0.91 0.99 0.95  --  0.90  CALCIUM 9.2 8.4* 8.8*  --  8.9   GFR: Estimated Creatinine Clearance: 106.1 mL/min (by C-G formula based on Cr of 0.9). Liver Function Tests:  Recent Labs Lab 02/09/16 1228 02/14/16 2004  AST 35 22  ALT 52 28  ALKPHOS 71 70  BILITOT 0.6 1.4*  PROT 7.6 7.7  ALBUMIN 4.3 3.6    Recent Labs Lab 02/09/16 1228 02/14/16 2004  LIPASE 21 19   No results for input(s): AMMONIA in the last 168 hours. Coagulation Profile: No results for input(s): INR, PROTIME in the last 168 hours. Cardiac Enzymes:  Recent Labs Lab 02/14/16 2004  TROPONINI <0.03   BNP (last 3 results) No results for input(s): PROBNP in the last 8760 hours. HbA1C: No results for input(s): HGBA1C in the last 72 hours. CBG:  Recent Labs Lab 02/15/16 1132 02/15/16 1703 02/15/16 2044 02/15/16 2342 02/16/16 0725  GLUCAP 382* 307* 473* 432* 306*   Lipid Profile: No results for input(s): CHOL, HDL, LDLCALC, TRIG, CHOLHDL, LDLDIRECT in the last 72 hours. Thyroid Function Tests: No results for input(s): TSH, T4TOTAL, FREET4, T3FREE, THYROIDAB in the last 72 hours. Anemia Panel: No results for input(s): VITAMINB12, FOLATE, FERRITIN, TIBC, IRON, RETICCTPCT in the last 72 hours. Urine analysis:    Component Value Date/Time   COLORURINE YELLOW 02/09/2016 1205   APPEARANCEUR CLEAR 02/09/2016 1205   LABSPEC 1.010 02/09/2016 1205   PHURINE 6.0 02/09/2016 1205   GLUCOSEU NEGATIVE 02/09/2016 1205   HGBUR NEGATIVE 02/09/2016 1205   BILIRUBINUR NEGATIVE 02/09/2016 1205   KETONESUR NEGATIVE 02/09/2016 1205   PROTEINUR NEGATIVE 02/09/2016 1205   UROBILINOGEN 1.0 05/15/2012 0005   NITRITE NEGATIVE 02/09/2016 1205   LEUKOCYTESUR NEGATIVE 02/09/2016 1205   Sepsis Labs: Invalid input(s): PROCALCITONIN, LACTICIDVEN  No results found for this or any previous visit (from the past 240 hour(s)).    Radiology Studies: Dg  Chest 2 View  02/14/2016  CLINICAL DATA:  Acute onset of cough and shortness of breath. Initial encounter. EXAM: CHEST  2 VIEW COMPARISON:  Chest radiograph performed 10/14/2013 FINDINGS: The lungs are well-aerated. Vascular congestion is noted. Mild left basilar opacity may reflect mild pneumonia or possibly mild asymmetric interstitial edema. There is no evidence of pleural effusion or pneumothorax. The heart is normal in size; the mediastinal contour is within normal limits. No acute osseous abnormalities are seen. IMPRESSION: Vascular congestion noted. Mild left basilar airspace opacity may reflect mild pneumonia or possibly mild asymmetric interstitial edema. Electronically Signed   By: Roanna Raider M.D.   On: 02/14/2016 18:22     Scheduled Meds: . aspirin EC  81 mg Oral QHS  . azithromycin  500 mg Intravenous Q24H  .  benzonatate  100 mg Oral TID  . cefTRIAXone (ROCEPHIN)  IV  1 g Intravenous Q24H  . enoxaparin (LOVENOX) injection  40 mg Subcutaneous Q24H  . guaiFENesin  600 mg Oral BID  . insulin aspart  0-9 Units Subcutaneous TID WC  . levalbuterol  0.63 mg Nebulization Q6H  . lisinopril  5 mg Oral QHS  . methylPREDNISolone (SOLU-MEDROL) injection  40 mg Intravenous Q6H  . omega-3 acid ethyl esters  1 g Oral BID  . polyethylene glycol  17 g Oral Daily  . senna-docusate  1 tablet Oral QHS  . sodium chloride flush  3 mL Intravenous Q12H   Continuous Infusions:    Pamella Pert, MD, PhD [facility] Pager [phone]  If 7PM-7AM, please contact night-coverage [email or web address] Password [facility] 02/16/2016, 8:48 AM

## 2016-02-16 NOTE — Progress Notes (Signed)
CBG 432.  On call MD notified no new orders at this time.  Will continue to monitor.

## 2016-02-17 LAB — HEMOGLOBIN A1C
Hgb A1c MFr Bld: 6.8 % — ABNORMAL HIGH (ref 4.8–5.6)
Mean Plasma Glucose: 148 mg/dL

## 2016-02-17 LAB — GLUCOSE, CAPILLARY: GLUCOSE-CAPILLARY: 239 mg/dL — AB (ref 65–99)

## 2016-02-17 MED ORDER — METFORMIN HCL 500 MG PO TABS: 500.0000 mg | ORAL_TABLET | Freq: Two times a day (BID) | ORAL | Status: DC

## 2016-02-17 MED ORDER — LEVOFLOXACIN 750 MG PO TABS: 750.0000 mg | ORAL_TABLET | Freq: Every day | ORAL | Status: DC

## 2016-02-17 MED ORDER — PREDNISONE 20 MG PO TABS: 30.0000 mg | ORAL_TABLET | Freq: Every day | ORAL | Status: DC

## 2016-02-17 MED ORDER — GUAIFENESIN ER 600 MG PO TB12: 600.0000 mg | ORAL_TABLET | Freq: Two times a day (BID) | ORAL | Status: DC

## 2016-02-17 NOTE — Care Management Note (Signed)
Case Management Note  Patient Details  Name: Greg Palmer MRN: [id number] Date of Birth: 10-29-1950  Expected Discharge Date:     02/17/2016             Expected Discharge Plan:  Home/Self Care  In-House Referral:     Discharge planning Services  CM Consult  Post Acute Care Choice:  NA Choice offered to:  NA  DME Arranged:    DME Agency:     HH Arranged:    HH Agency:     Status of Service:  Completed, signed off  Medicare Important Message Given:    Date Medicare IM Given:    Medicare IM give by:    Date Additional Medicare IM Given:    Additional Medicare Important Message give by:     If discussed at Long Length of Stay Meetings, dates discussed:    Additional Comments: Patient discharged home with self care. No CM needs.   Shylyn Younce, Chrystine OilerSharley Diane, RN 02/17/2016, 9:09 AM

## 2016-02-17 NOTE — Progress Notes (Signed)
Patient with order to be discharge home. Discharge instructions given, patient verbalized understanding. Prescriptions given. Patient stable. Patient left in private vehicle with spouse.

## 2016-02-17 NOTE — Discharge Instructions (Signed)
Follow with Glo Herring., MD in 5-7 days  Please get a complete blood count and chemistry panel checked by your Primary MD at your next visit, and again as instructed by your Primary MD. Please get your medications reviewed and adjusted by your Primary MD.  Please request your Primary MD to go over all Hospital Tests and Procedure/Radiological results at the follow up, please get all Hospital records sent to your Prim MD by signing hospital release before you go home.  If you had Pneumonia of Lung problems at the Hospital: Please get a 2 view Chest X ray done in 6-8 weeks after hospital discharge or sooner if instructed by your Primary MD.  If you have Congestive Heart Failure: Please call your Cardiologist or Primary MD anytime you have any of the following symptoms:  1) 3 pound weight gain in 24 hours or 5 pounds in 1 week  2) shortness of breath, with or without a dry hacking cough  3) swelling in the hands, feet or stomach  4) if you have to sleep on extra pillows at night in order to breathe  Follow cardiac low salt diet and 1.5 lit/day fluid restriction.  If you have diabetes Accuchecks 4 times/day, Once in AM empty stomach and then before each meal. Log in all results and show them to your primary doctor at your next visit. If any glucose reading is under 80 or above 300 call your primary MD immediately.  If you have Seizure/Convulsions/Epilepsy: Please do not drive, operate heavy machinery, participate in activities at heights or participate in high speed sports until you have seen by Primary MD or a Neurologist and advised to do so again.  If you had Gastrointestinal Bleeding: Please ask your Primary MD to check a complete blood count within one week of discharge or at your next visit. Your endoscopic/colonoscopic biopsies that are pending at the time of discharge, will also need to followed by your Primary MD.  Get Medicines reviewed and adjusted. Please take all your  medications with you for your next visit with your Primary MD  Please request your Primary MD to go over all hospital tests and procedure/radiological results at the follow up, please ask your Primary MD to get all Hospital records sent to his/her office.  If you experience worsening of your admission symptoms, develop shortness of breath, life threatening emergency, suicidal or homicidal thoughts you must seek medical attention immediately by calling 911 or calling your MD immediately  if symptoms less severe.  You must read complete instructions/literature along with all the possible adverse reactions/side effects for all the Medicines you take and that have been prescribed to you. Take any new Medicines after you have completely understood and accpet all the possible adverse reactions/side effects.   Do not drive or operate heavy machinery when taking Pain medications.   Do not take more than prescribed Pain, Sleep and Anxiety Medications  Special Instructions: If you have smoked or chewed Tobacco  in the last 2 yrs please stop smoking, stop any regular Alcohol  and or any Recreational drug use.  Wear Seat belts while driving.  Please note You were cared for by a hospitalist during your hospital stay. If you have any questions about your discharge medications or the care you received while you were in the hospital after you are discharged, you can call the unit and asked to speak with the hospitalist on call if the hospitalist that took care of you is not available. Once  you are discharged, your primary care physician will handle any further medical issues. Please note that NO REFILLS for any discharge medications will be authorized once you are discharged, as it is imperative that you return to your primary care physician (or establish a relationship with a primary care physician if you do not have one) for your aftercare needs so that they can reassess your need for medications and monitor your  lab values.  You can reach the hospitalist office at phone [phone] or fax [phone]   If you do not have a primary care physician, you can call [phone] for a physician referral.  Activity: As tolerated with Full fall precautions use walker/cane & assistance as needed  Diet: diabetic  Disposition Home

## 2016-02-17 NOTE — Discharge Summary (Signed)
Physician Discharge Summary  Greg Palmer ZOX:[id number] DOB: October 08, 1950 DOA: 02/14/2016  PCP: Cassell Smiles., MD  Admit date: 02/14/2016 Discharge date: 02/17/2016  Time spent: >30 minutes  Recommendations for Outpatient Follow-up:  1. Follow up with Dr. Sherwood Gambler in 1 week 2. Levaquin for 4 additional days 3. Prednisone taper on discharge 4. Patient diagnosed with DM and A1C 6.8, started on Metformin   Discharge Diagnoses:  Principal Problem:   Acute respiratory failure with hypoxemia (HCC) Active Problems:   COPD with exacerbation (HCC)   HLD (hyperlipidemia)   HTN (hypertension)   Acute bronchitis   Elevated brain natriuretic peptide (BNP) level   Constipation  Discharge Condition: stable  Diet recommendation: diabetic  Filed Weights   02/14/16 1735  Weight: 113.399 kg (250 lb)    History of present illness:  See H&P, Labs, Consult and Test reports for all details in brief, patient is a 65 y.o. male with medical history significant of COPD, HLD, and HLD presented with complaints of productive cough with green sputum, wheezing, and SOB for the past 6 days. Patient denies chest pain and recent tobacco use. No relief with OTC medications and noted he was seen five days ago in ED but reports that he has progressively worsened. He was Tx with neb and continued to have wheezing, thus he was given IV steroids, and hospitalist was asked to admit him for COPD exacerbation with hypoxia.  Hospital Course:  COPD exacerbation with acute hypoxic respiratory failure - patient was admitted to the hospital with COPD exacerbation, and was started on antibiotics, steroids and breathing treatments. His clinical condition improved and was able to be weaned off his oxygen. He is able to ambulate on room air without significant dyspnea and maintain good O2 sats, his antibiotics and steroids were converted to po and was discharged home in stable condition. Recommended PCP follow up in ~1 week DM  - patient with steroid induced hyperglycemia, A1C in the 6.8 range. With prednisone taper suspect his CBGs will improve, he was started on metformin 500 BID, and if he tolerates this may be increased to 1000 BID as an outpatient. His wife is a diabetic and has a glucometer, discussed with him regarding checking CBGs on a regular basis, keeping a log to show his PCP at his next appointment. Expressed understanding. Discussed about diet as well. Of note, patient mentioned that he does not likely anything "diet" and is drinking regular sugary drinks, discussed about minimizing. Will need further education as an outpatient.  Hypertension - Continue home medications  Procedures:  None    Consultations:  None   Discharge Exam: Filed Vitals:   02/16/16 1941 02/16/16 2121 02/17/16 0656 02/17/16 0803  BP:  145/86 147/90   Pulse:  99 86   Temp:  98.8 F (37.1 C) 98.2 F (36.8 C)   TempSrc:  Oral Oral   Resp:   20   Height:      Weight:      SpO2: 94% 92% 95% 94%    General: NAD Cardiovascular: RRR Respiratory: CTA biL  Discharge Instructions Activity:  As tolerated   Get Medicines reviewed and adjusted: Please take all your medications with you for your next visit with your Primary MD  Please request your Primary MD to go over all hospital tests and procedure/radiological results at the follow up, please ask your Primary MD to get all Hospital records sent to his/her office.  If you experience worsening of your admission symptoms, develop shortness of  breath, life threatening emergency, suicidal or homicidal thoughts you must seek medical attention immediately by calling 911 or calling your MD immediately if symptoms less severe.  You must read complete instructions/literature along with all the possible adverse reactions/side effects for all the Medicines you take and that have been prescribed to you. Take any new Medicines after you have completely understood and accpet all the  possible adverse reactions/side effects.   Do not drive when taking Pain medications.   Do not take more than prescribed Pain, Sleep and Anxiety Medications  Special Instructions: If you have smoked or chewed Tobacco in the last 2 yrs please stop smoking, stop any regular Alcohol and or any Recreational drug use.  Wear Seat belts while driving.  Please note  You were cared for by a hospitalist during your hospital stay. Once you are discharged, your primary care physician will handle any further medical issues. Please note that NO REFILLS for any discharge medications will be authorized once you are discharged, as it is imperative that you return to your primary care physician (or establish a relationship with a primary care physician if you do not have one) for your aftercare needs so that they can reassess your need for medications and monitor your lab values.    Medication List    TAKE these medications        albuterol 108 (90 Base) MCG/ACT inhaler  Commonly known as:  PROVENTIL HFA;VENTOLIN HFA  Inhale 2 puffs into the lungs every 4 (four) hours as needed for wheezing.     aspirin EC 81 MG tablet  Take 81 mg by mouth at bedtime.     Fish Oil 1000 MG Caps  Take 1 capsule by mouth at bedtime.     guaiFENesin 600 MG 12 hr tablet  Commonly known as:  MUCINEX  Take 1 tablet (600 mg total) by mouth 2 (two) times daily.     levofloxacin 750 MG tablet  Commonly known as:  LEVAQUIN  Take 1 tablet (750 mg total) by mouth daily.     lisinopril 5 MG tablet  Commonly known as:  PRINIVIL,ZESTRIL  Take 1 tablet by mouth at bedtime.     metFORMIN 500 MG tablet  Commonly known as:  GLUCOPHAGE  Take 1 tablet (500 mg total) by mouth 2 (two) times daily with a meal.     multivitamins ther. w/minerals Tabs tablet  Take 1 tablet by mouth at bedtime.     ondansetron 8 MG disintegrating tablet  Commonly known as:  ZOFRAN ODT  8mg  ODT q4 hours prn nausea     predniSONE 20 MG tablet    Commonly known as:  DELTASONE  Take 1.5 tablets (30 mg total) by mouth daily with breakfast. 30 mg daily x 2 days then 20 mg daily x 2 days then 10 mg daily x 2 days           Follow-up Information    Follow up with Cassell SmilesFUSCO,LAWRENCE J., MD On 02/23/2016.   Specialty:  Internal Medicine   Why:  @ 4:00    Contact information:   [address] [place] [address] [phone]       The results of significant diagnostics from this hospitalization (including imaging, microbiology, ancillary and laboratory) are listed below for reference.    Significant Diagnostic Studies: Dg Chest 2 View  02/14/2016  CLINICAL DATA:  Acute onset of cough and shortness of breath. Initial encounter. EXAM: CHEST  2 VIEW COMPARISON:  Chest radiograph  performed 10/14/2013 FINDINGS: The lungs are well-aerated. Vascular congestion is noted. Mild left basilar opacity may reflect mild pneumonia or possibly mild asymmetric interstitial edema. There is no evidence of pleural effusion or pneumothorax. The heart is normal in size; the mediastinal contour is within normal limits. No acute osseous abnormalities are seen. IMPRESSION: Vascular congestion noted. Mild left basilar airspace opacity may reflect mild pneumonia or possibly mild asymmetric interstitial edema. Electronically Signed   By: Roanna Raider M.D.   On: 02/14/2016 18:22    Microbiology: No results found for this or any previous visit (from the past 240 hour(s)).   Labs: Basic Metabolic Panel:  Recent Labs Lab 02/14/16 2004 02/15/16 0500 02/15/16 2146 02/16/16 0547  NA 134* 136  --  136  K 4.0 4.1  --  3.9  CL 101 103  --  104  CO2 25 25  --  24  GLUCOSE 125* 320* 472* 376*  BUN 19 17  --  22*  CREATININE 0.99 0.95  --  0.90  CALCIUM 8.4* 8.8*  --  8.9   Liver Function Tests:  Recent Labs Lab 02/14/16 2004  AST 22  ALT 28  ALKPHOS 70  BILITOT 1.4*  PROT 7.7  ALBUMIN 3.6    Recent Labs Lab 02/14/16 2004  LIPASE 19   No  results for input(s): AMMONIA in the last 168 hours. CBC:  Recent Labs Lab 02/14/16 2004 02/15/16 0500 02/16/16 0547  WBC 15.0* 13.0* 15.0*  HGB 13.1 13.4 12.8*  HCT 39.0 40.5 37.8*  MCV 91.8 91.8 89.8  PLT 192 202 261   Cardiac Enzymes:  Recent Labs Lab 02/14/16 2004  TROPONINI <0.03   BNP: BNP (last 3 results)  Recent Labs  02/14/16 2004  BNP 141.0*    ProBNP (last 3 results) No results for input(s): PROBNP in the last 8760 hours.  CBG:  Recent Labs Lab 02/16/16 0725 02/16/16 1129 02/16/16 1613 02/16/16 2117 02/17/16 0740  GLUCAP 306* 317* 303* 288* 239*       Signed:  GHERGHE, COSTIN  [facility] 02/17/2016, 11:13 AM

## 2016-07-25 DIAGNOSIS — H25012 Cortical age-related cataract, left eye: Secondary | ICD-10-CM | POA: Diagnosis not present

## 2016-07-25 DIAGNOSIS — H2513 Age-related nuclear cataract, bilateral: Secondary | ICD-10-CM | POA: Diagnosis not present

## 2016-07-26 NOTE — Patient Instructions (Signed)
Your procedure is scheduled on: 08/01/2016  Report to [facility] at  700  AM.  Call this number if you have problems the morning of surgery: [phone]   Do not eat food or drink liquids :After Midnight.      Take these medicines the morning of surgery with A SIP OF WATER: mucinex, lisinopril, zofran. Take your inhaler before you come.   Do not wear jewelry, make-up or nail polish.  Do not wear lotions, powders, or perfumes. You may wear deodorant.  Do not shave 48 hours prior to surgery.  Do not bring valuables to the hospital.  Contacts, dentures or bridgework may not be worn into surgery.  Leave suitcase in the car. After surgery it may be brought to your room.  For patients admitted to the hospital, checkout time is 11:00 AM the day of discharge.   Patients discharged the day of surgery will not be allowed to drive home.  :     Please read over the following fact sheets that you were given: Coughing and Deep Breathing, Surgical Site Infection Prevention, Anesthesia Post-op Instructions and Care and Recovery After Surgery    Cataract A cataract is a clouding of the lens of the eye. When a lens becomes cloudy, vision is reduced based on the degree and nature of the clouding. Many cataracts reduce vision to some degree. Some cataracts make people more near-sighted as they develop. Other cataracts increase glare. Cataracts that are ignored and become worse can sometimes look white. The white color can be seen through the pupil. CAUSES   Aging. However, cataracts may occur at any age, even in newborns.   Certain drugs.   Trauma to the eye.   Certain diseases such as diabetes.   Specific eye diseases such as chronic inflammation inside the eye or a sudden attack of a rare form of glaucoma.   Inherited or acquired medical problems.  SYMPTOMS   Gradual, progressive drop in vision in the affected eye.   Severe, rapid visual loss. This most often happens when trauma is the cause.    DIAGNOSIS  To detect a cataract, an eye doctor examines the lens. Cataracts are best diagnosed with an exam of the eyes with the pupils enlarged (dilated) by drops.  TREATMENT  For an early cataract, vision may improve by using different eyeglasses or stronger lighting. If that does not help your vision, surgery is the only effective treatment. A cataract needs to be surgically removed when vision loss interferes with your everyday activities, such as driving, reading, or watching TV. A cataract may also have to be removed if it prevents examination or treatment of another eye problem. Surgery removes the cloudy lens and usually replaces it with a substitute lens (intraocular lens, IOL).  At a time when both you and your doctor agree, the cataract will be surgically removed. If you have cataracts in both eyes, only one is usually removed at a time. This allows the operated eye to heal and be out of danger from any possible problems after surgery (such as infection or poor wound healing). In rare cases, a cataract may be doing damage to your eye. In these cases, your caregiver may advise surgical removal right away. The vast majority of people who have cataract surgery have better vision afterward. HOME CARE INSTRUCTIONS  If you are not planning surgery, you may be asked to do the following:  Use different eyeglasses.   Use stronger or brighter lighting.   Ask  your eye doctor about reducing your medicine dose or changing medicines if it is thought that a medicine caused your cataract. Changing medicines does not make the cataract go away on its own.   Become familiar with your surroundings. Poor vision can lead to injury. Avoid bumping into things on the affected side. You are at a higher risk for tripping or falling.   Exercise extreme care when driving or operating machinery.   Wear sunglasses if you are sensitive to bright light or experiencing problems with glare.  SEEK IMMEDIATE MEDICAL CARE  IF:   You have a worsening or sudden vision loss.   You notice redness, swelling, or increasing pain in the eye.   You have a fever.  Document Released: 09/04/2005 Document Revised: 08/24/2011 Document Reviewed: 04/28/2011 [facility] Patient Information 2012 [place].PATIENT INSTRUCTIONS POST-ANESTHESIA  IMMEDIATELY FOLLOWING SURGERY:  Do not drive or operate machinery for the first twenty four hours after surgery.  Do not make any important decisions for twenty four hours after surgery or while taking narcotic pain medications or sedatives.  If you develop intractable nausea and vomiting or a severe headache please notify your doctor immediately.  FOLLOW-UP:  Please make an appointment with your surgeon as instructed. You do not need to follow up with anesthesia unless specifically instructed to do so.  WOUND CARE INSTRUCTIONS (if applicable):  Keep a dry clean dressing on the anesthesia/puncture wound site if there is drainage.  Once the wound has quit draining you may leave it open to air.  Generally you should leave the bandage intact for twenty four hours unless there is drainage.  If the epidural site drains for more than 36-48 hours please call the anesthesia department.  QUESTIONS?:  Please feel free to call your physician or the hospital operator if you have any questions, and they will be happy to assist you.

## 2016-07-28 ENCOUNTER — Encounter (HOSPITAL_COMMUNITY)
Admission: RE | Admit: 2016-07-28 | Discharge: 2016-07-28 | Disposition: A | Discharge status: Home / Self Care | Payer: Medicare Other | Source: Ambulatory Visit | Attending: Ophthalmology | Admitting: Ophthalmology

## 2016-07-28 ENCOUNTER — Encounter (HOSPITAL_COMMUNITY): Payer: Self-pay

## 2016-07-28 DIAGNOSIS — H2512 Age-related nuclear cataract, left eye: Secondary | ICD-10-CM | POA: Diagnosis not present

## 2016-07-28 DIAGNOSIS — Z01812 Encounter for preprocedural laboratory examination: Secondary | ICD-10-CM | POA: Diagnosis not present

## 2016-07-28 LAB — BASIC METABOLIC PANEL
Anion gap: 6 (ref 5–15)
BUN: 15 mg/dL (ref 6–20)
CO2: 26 mmol/L (ref 22–32)
CREATININE: 1.16 mg/dL (ref 0.61–1.24)
Calcium: 8.7 mg/dL — ABNORMAL LOW (ref 8.9–10.3)
Chloride: 104 mmol/L (ref 101–111)
GFR calc Af Amer: >60 mL/min (ref >60)
Glucose, Bld: 116 mg/dL — ABNORMAL HIGH (ref 65–99)
Potassium: 3.8 mmol/L (ref 3.5–5.1)
SODIUM: 136 mmol/L (ref 135–145)

## 2016-07-28 LAB — CBC WITH DIFFERENTIAL/PLATELET
Basophils Absolute: 0.1 10*3/uL (ref 0.0–0.1)
Basophils Relative: 1 %
EOS ABS: 0.2 10*3/uL (ref 0.0–0.7)
EOS PCT: 3 %
HCT: 42.1 % (ref 39.0–52.0)
Hemoglobin: 14.6 g/dL (ref 13.0–17.0)
LYMPHS ABS: 2.9 10*3/uL (ref 0.7–4.0)
Lymphocytes Relative: 43 %
MCH: 30.2 pg (ref 26.0–34.0)
MCHC: 34.7 g/dL (ref 30.0–36.0)
MCV: 87 fL (ref 78.0–100.0)
MONOS PCT: 10 %
Monocytes Absolute: 0.7 10*3/uL (ref 0.1–1.0)
Neutro Abs: 2.9 10*3/uL (ref 1.7–7.7)
Neutrophils Relative %: 43 %
PLATELETS: 155 10*3/uL (ref 150–400)
RBC: 4.84 MIL/uL (ref 4.22–5.81)
RDW: 13.7 % (ref 11.5–15.5)
WBC: 6.7 10*3/uL (ref 4.0–10.5)

## 2016-07-28 NOTE — Progress Notes (Signed)
   07/28/16 1449  OBSTRUCTIVE SLEEP APNEA  Have you ever been diagnosed with sleep apnea through a sleep study? No  Do you snore loudly (loud enough to be heard through closed doors)?  1  Do you often feel tired, fatigued, or sleepy during the daytime (such as falling asleep during driving or talking to someone)? 0  Has anyone observed you stop breathing during your sleep? 1  Do you have, or are you being treated for high blood pressure? 1  BMI more than 35 kg/m2? 0  Age > 50 (1-yes) 1  Neck circumference greater than:Male 16 inches or larger, Male 17inches or larger? 1  Male Gender (Yes=1) 1  Obstructive Sleep Apnea Score 6  Score 5 or greater  Results sent to PCP

## 2016-08-01 ENCOUNTER — Ambulatory Visit (HOSPITAL_COMMUNITY): Payer: Medicare Other | Admitting: Anesthesiology

## 2016-08-01 ENCOUNTER — Encounter (HOSPITAL_COMMUNITY): Payer: Self-pay | Admitting: *Deleted

## 2016-08-01 ENCOUNTER — Encounter (HOSPITAL_COMMUNITY)
Admission: RE | Disposition: A | Discharge status: Home / Self Care | Payer: Self-pay | Source: Ambulatory Visit | Attending: Ophthalmology

## 2016-08-01 ENCOUNTER — Ambulatory Visit (HOSPITAL_COMMUNITY)
Admission: RE | Admit: 2016-08-01 | Discharge: 2016-08-01 | Disposition: A | Discharge status: Home / Self Care | Payer: Medicare Other | Source: Ambulatory Visit | Attending: Ophthalmology | Admitting: Ophthalmology

## 2016-08-01 DIAGNOSIS — J449 Chronic obstructive pulmonary disease, unspecified: Secondary | ICD-10-CM | POA: Insufficient documentation

## 2016-08-01 DIAGNOSIS — Z87891 Personal history of nicotine dependence: Secondary | ICD-10-CM | POA: Insufficient documentation

## 2016-08-01 DIAGNOSIS — I10 Essential (primary) hypertension: Secondary | ICD-10-CM | POA: Insufficient documentation

## 2016-08-01 DIAGNOSIS — Z79899 Other long term (current) drug therapy: Secondary | ICD-10-CM | POA: Insufficient documentation

## 2016-08-01 DIAGNOSIS — H269 Unspecified cataract: Secondary | ICD-10-CM | POA: Diagnosis not present

## 2016-08-01 DIAGNOSIS — H2511 Age-related nuclear cataract, right eye: Secondary | ICD-10-CM | POA: Diagnosis not present

## 2016-08-01 DIAGNOSIS — Z7982 Long term (current) use of aspirin: Secondary | ICD-10-CM | POA: Diagnosis not present

## 2016-08-01 DIAGNOSIS — H25012 Cortical age-related cataract, left eye: Secondary | ICD-10-CM | POA: Diagnosis not present

## 2016-08-01 DIAGNOSIS — H2512 Age-related nuclear cataract, left eye: Secondary | ICD-10-CM | POA: Diagnosis not present

## 2016-08-01 HISTORY — PX: CATARACT EXTRACTION W/PHACO: SHX586

## 2016-08-01 SURGERY — PHACOEMULSIFICATION, CATARACT, WITH IOL INSERTION
Anesthesia: Monitor Anesthesia Care | Site: Eye | Laterality: Left

## 2016-08-01 MED ORDER — TETRACAINE HCL 0.5 % OP SOLN
1.0000 [drp] | OPHTHALMIC | Status: AC
  Administered 2016-08-01 (×3): 1 [drp] via OPHTHALMIC

## 2016-08-01 MED ORDER — KETOROLAC TROMETHAMINE 0.5 % OP SOLN
1.0000 [drp] | OPHTHALMIC | Status: AC
  Administered 2016-08-01 (×3): 1 [drp] via OPHTHALMIC

## 2016-08-01 MED ORDER — CYCLOPENTOLATE-PHENYLEPHRINE 0.2-1 % OP SOLN
1.0000 [drp] | OPHTHALMIC | Status: AC
  Administered 2016-08-01 (×3): 1 [drp] via OPHTHALMIC

## 2016-08-01 MED ORDER — LACTATED RINGERS IV SOLN
INTRAVENOUS | Status: DC
  Administered 2016-08-01: 07:00:00 via INTRAVENOUS

## 2016-08-01 MED ORDER — MIDAZOLAM HCL 2 MG/2ML IJ SOLN
INTRAMUSCULAR | Status: AC
Start: 2016-08-01 — End: 2016-08-01
  Filled 2016-08-01: qty 2

## 2016-08-01 MED ORDER — TETRACAINE 0.5 % OP SOLN OPTIME - NO CHARGE
OPHTHALMIC | Status: DC | PRN
  Administered 2016-08-01: 1 [drp] via OPHTHALMIC

## 2016-08-01 MED ORDER — FENTANYL CITRATE (PF) 100 MCG/2ML IJ SOLN
25.0000 ug | INTRAMUSCULAR | Status: AC | PRN
  Administered 2016-08-01 (×2): 25 ug via INTRAVENOUS
  Filled 2016-08-01: qty 2

## 2016-08-01 MED ORDER — EPINEPHRINE PF 1 MG/ML IJ SOLN
INTRAOCULAR | Status: DC | PRN
  Administered 2016-08-01: 500 mL

## 2016-08-01 MED ORDER — MIDAZOLAM HCL 2 MG/2ML IJ SOLN
1.0000 mg | INTRAMUSCULAR | Status: DC | PRN
Start: 2016-08-01 — End: 2016-08-01
  Administered 2016-08-01 (×3): 2 mg via INTRAVENOUS
  Filled 2016-08-01: qty 2

## 2016-08-01 MED ORDER — PROVISC 10 MG/ML IO SOLN
INTRAOCULAR | Status: DC | PRN
  Administered 2016-08-01: 0.85 mL via INTRAOCULAR

## 2016-08-01 MED ORDER — LIDOCAINE HCL (PF) 1 % IJ SOLN
INTRAMUSCULAR | Status: AC
Start: 2016-08-01 — End: 2016-08-01
  Filled 2016-08-01: qty 2

## 2016-08-01 MED ORDER — LIDOCAINE HCL (PF) 1 % IJ SOLN
INTRAMUSCULAR | Status: DC | PRN
  Administered 2016-08-01: 1.5 mL

## 2016-08-01 MED ORDER — TRYPAN BLUE 0.06 % OP SOLN
OPHTHALMIC | Status: AC
Start: 2016-08-01 — End: 2016-08-01
  Filled 2016-08-01: qty 0.5

## 2016-08-01 MED ORDER — EPINEPHRINE PF 1 MG/ML IJ SOLN
INTRAMUSCULAR | Status: AC
  Filled 2016-08-01: qty 1

## 2016-08-01 MED ORDER — BSS IO SOLN
INTRAOCULAR | Status: DC | PRN
  Administered 2016-08-01: 15 mL

## 2016-08-01 MED ORDER — PHENYLEPHRINE HCL 2.5 % OP SOLN
1.0000 [drp] | OPHTHALMIC | Status: AC
  Administered 2016-08-01 (×3): 1 [drp] via OPHTHALMIC

## 2016-08-01 MED ORDER — MIDAZOLAM HCL 2 MG/2ML IJ SOLN
INTRAMUSCULAR | Status: AC
  Filled 2016-08-01: qty 2

## 2016-08-01 MED ORDER — TRYPAN BLUE 0.06 % OP SOLN
OPHTHALMIC | Status: DC | PRN
  Administered 2016-08-01: 0.5 mL via INTRAOCULAR

## 2016-08-01 SURGICAL SUPPLY — 10 items

## 2016-08-01 NOTE — H&P (Signed)
The patient was re examined and there is no change in the patients condition since the original H and P. 

## 2016-08-01 NOTE — Anesthesia Preprocedure Evaluation (Signed)
Anesthesia Evaluation  Patient identified by MRN, date of birth, ID band Patient awake    Reviewed: Allergy & Precautions, NPO status , Patient's Chart, lab work & pertinent test results  Airway Mallampati: II  TM Distance: >3 FB     Dental  (+) Poor Dentition   Pulmonary COPD, former smoker,    breath sounds clear to auscultation       Cardiovascular hypertension, Pt. on medications  Rhythm:Regular Rate:Normal     Neuro/Psych    GI/Hepatic negative GI ROS, Neg liver ROS,   Endo/Other    Renal/GU      Musculoskeletal   Abdominal   Peds  Hematology   Anesthesia Other Findings   Reproductive/Obstetrics                             Anesthesia Physical Anesthesia Plan  ASA: III  Anesthesia Plan: MAC   Post-op Pain Management:    Induction: Intravenous  Airway Management Planned: Nasal Cannula  Additional Equipment:   Intra-op Plan:   Post-operative Plan:   Informed Consent: I have reviewed the patients History and Physical, chart, labs and discussed the procedure including the risks, benefits and alternatives for the proposed anesthesia with the patient or authorized representative who has indicated his/her understanding and acceptance.     Plan Discussed with:   Anesthesia Plan Comments:         Anesthesia Quick Evaluation

## 2016-08-01 NOTE — Anesthesia Postprocedure Evaluation (Signed)
Anesthesia Post Note  Patient: Greg Palmer  Procedure(s) Performed: Procedure(s) (LRB): CATARACT EXTRACTION PHACO AND INTRAOCULAR LENS PLACEMENT (IOC) (Left)  Anesthesia Post Evaluation  Last Vitals:  Vitals:   08/01/16 0810 08/01/16 0811  BP:    Pulse:    Resp: 17 15  Temp:      Last Pain:  Vitals:   08/01/16 0721  TempSrc: Oral                 Perez Dirico  Anesthesia Post-op Note  Patient: Greg Palmer  Procedure(s) Performed: Procedure(s) (LRB): CATARACT EXTRACTION PHACO AND INTRAOCULAR LENS PLACEMENT (IOC) (Left)  Patient Location:  Short Stay  Anesthesia Type: MAC  Level of Consciousness: awake  Airway and Oxygen Therapy: Patient Spontanous Breathing  Post-op Pain: none  Post-op Assessment: Post-op Vital signs reviewed, Patient's Cardiovascular Status Stable, Respiratory Function Stable, Patent Airway, No signs of Nausea or vomiting and Pain level controlled  Post-op Vital Signs: Reviewed and stable  Complications: No apparent anesthesia complications

## 2016-08-01 NOTE — Transfer of Care (Signed)
Immediate Anesthesia Transfer of Care Note  Patient: Greg Palmer  Procedure(s) Performed: Procedure(s) (LRB): CATARACT EXTRACTION PHACO AND INTRAOCULAR LENS PLACEMENT (IOC) (Left)  Patient Location: Shortstay  Anesthesia Type: MAC  Level of Consciousness: awake  Airway & Oxygen Therapy: Patient Spontanous Breathing   Post-op Assessment: Report given to PACU RN, Post -op Vital signs reviewed and stable and Patient moving all extremities  Post vital signs: Reviewed and stable  Complications: No apparent anesthesia complications

## 2016-08-01 NOTE — Anesthesia Procedure Notes (Signed)
Procedure Name: MAC Date/Time: 08/01/2016 8:14 AM Performed by: Franco NonesYATES, Nolia Tschantz S Pre-anesthesia Checklist: Patient identified, Emergency Drugs available, Suction available, Timeout performed and Patient being monitored Patient Re-evaluated:Patient Re-evaluated prior to inductionOxygen Delivery Method: Nasal Cannula

## 2016-08-01 NOTE — Discharge Instructions (Signed)
°  °          [facility] Instructions °[address]- [place] [address]-[place] °    ° °1. Avoid closing eyes tightly. One often closes the eye tightly when laughing, talking, sneezing, coughing or if they feel irritated. At these times, you should be careful not to close your eyes tightly. ° °2. Instill eye drops as instructed. To instill drops in your eye, open it, look up and have someone gently pull the lower lid down and instill a couple of drops inside the lower lid. ° °3. Do not touch upper lid. ° °4. Take Advil or Tylenol for pain. ° °5. You may use either eye for near work, such as reading or sewing and you may watch television. ° °6. You may have your hair done at the beauty parlor at any time. ° °7. Wear dark glasses with or without your own glasses if you are in bright light. ° °8. Call our office at [phone] or [phone] if you have sharp pain in your eye or unusual symptoms. ° °9.  FOLLOW UP WITH DR. SHAPIRO TODAY IN HIS [place] OFFICE AT 3:00pm. ° °  °I have received a copy of the above instructions and will follow them.  ° ° ° °IF YOU ARE IN IMMEDIATE DANGER CALL 911! ° °It is important for you to keep your follow-up appointment with your physician after discharge, OR, for you /your caregiver to make a follow-up appointment with your physician / medical provider after discharge. ° °Show these instructions to the next healthcare provider you see. ° ° °PATIENT INSTRUCTIONS °POST-ANESTHESIA ° °IMMEDIATELY FOLLOWING SURGERY:  Do not drive or operate machinery for the first twenty four hours after surgery.  Do not make any important decisions for twenty four hours after surgery or while taking narcotic pain medications or sedatives.  If you develop intractable nausea and vomiting or a severe headache please notify your doctor immediately. ° °FOLLOW-UP:  Please make an appointment with your surgeon as instructed. You do not need to follow up with anesthesia unless  specifically instructed to do so. ° °WOUND CARE INSTRUCTIONS (if applicable):  Keep a dry clean dressing on the anesthesia/puncture wound site if there is drainage.  Once the wound has quit draining you may leave it open to air.  Generally you should leave the bandage intact for twenty four hours unless there is drainage.  If the epidural site drains for more than 36-48 hours please call the anesthesia department. ° °QUESTIONS?:  Please feel free to call your physician or the hospital operator if you have any questions, and they will be happy to assist you.    ° ° ° °

## 2016-08-01 NOTE — Op Note (Signed)
Patient brought to the operating room and prepped and draped in the usual manner.  Lid speculum inserted in left eye.  Stab incision made at the twelve o'clock position. Trypan Blue instilled and removed. Provisc instilled in the anterior chamber.   A 2.4 mm. Stab incision was made temporally.  An anterior capsulotomy was done with a bent 25 gauge needle.  The nucleus was hydrodissected.  The Phaco tip was inserted in the anterior chamber and the nucleus was emulsified.  CDE was 29.22.  The cortical material was then removed with the I and A tip.  Posterior capsule was the polished.  The anterior chamber was deepened with Provisc.  A 17.5 Diopter Alcon AU00T0 IOL was then inserted in the capsular bag.  Provisc was then removed with the I and A tip.  The wound was then hydrated.  Patient sent to the Recovery Room in good condition with follow up in my office.  Preoperative Diagnosis:  Nuclear Cataract OS Postoperative Diagnosis:  Same Procedure name: Kelman Phacoemulsification OS with IOL

## 2016-08-02 ENCOUNTER — Encounter (HOSPITAL_COMMUNITY): Payer: Self-pay | Admitting: Ophthalmology

## 2016-08-09 ENCOUNTER — Encounter (HOSPITAL_COMMUNITY)
Admission: RE | Admit: 2016-08-09 | Discharge: 2016-08-09 | Disposition: A | Discharge status: Home / Self Care | Payer: Medicare Other | Source: Ambulatory Visit | Attending: Ophthalmology | Admitting: Ophthalmology

## 2016-08-09 ENCOUNTER — Encounter (HOSPITAL_COMMUNITY): Payer: Self-pay

## 2016-08-15 ENCOUNTER — Encounter (HOSPITAL_COMMUNITY)
Admission: RE | Disposition: A | Discharge status: Home / Self Care | Payer: Self-pay | Source: Ambulatory Visit | Attending: Ophthalmology

## 2016-08-15 ENCOUNTER — Ambulatory Visit (HOSPITAL_COMMUNITY): Payer: Medicare Other | Admitting: Anesthesiology

## 2016-08-15 ENCOUNTER — Encounter (HOSPITAL_COMMUNITY): Payer: Self-pay | Admitting: *Deleted

## 2016-08-15 ENCOUNTER — Ambulatory Visit (HOSPITAL_COMMUNITY)
Admission: RE | Admit: 2016-08-15 | Discharge: 2016-08-15 | Disposition: A | Discharge status: Home / Self Care | Payer: Medicare Other | Source: Ambulatory Visit | Attending: Ophthalmology | Admitting: Ophthalmology

## 2016-08-15 DIAGNOSIS — H2511 Age-related nuclear cataract, right eye: Secondary | ICD-10-CM | POA: Diagnosis not present

## 2016-08-15 DIAGNOSIS — Z79899 Other long term (current) drug therapy: Secondary | ICD-10-CM | POA: Diagnosis not present

## 2016-08-15 DIAGNOSIS — J449 Chronic obstructive pulmonary disease, unspecified: Secondary | ICD-10-CM | POA: Insufficient documentation

## 2016-08-15 DIAGNOSIS — H269 Unspecified cataract: Secondary | ICD-10-CM | POA: Insufficient documentation

## 2016-08-15 DIAGNOSIS — I10 Essential (primary) hypertension: Secondary | ICD-10-CM | POA: Insufficient documentation

## 2016-08-15 DIAGNOSIS — Z7982 Long term (current) use of aspirin: Secondary | ICD-10-CM | POA: Insufficient documentation

## 2016-08-15 DIAGNOSIS — H25011 Cortical age-related cataract, right eye: Secondary | ICD-10-CM | POA: Diagnosis not present

## 2016-08-15 DIAGNOSIS — Z87891 Personal history of nicotine dependence: Secondary | ICD-10-CM | POA: Diagnosis not present

## 2016-08-15 HISTORY — PX: CATARACT EXTRACTION W/PHACO: SHX586

## 2016-08-15 LAB — GLUCOSE, CAPILLARY: Glucose-Capillary: 109 mg/dL — ABNORMAL HIGH (ref 65–99)

## 2016-08-15 SURGERY — PHACOEMULSIFICATION, CATARACT, WITH IOL INSERTION
Anesthesia: Monitor Anesthesia Care | Site: Eye | Laterality: Right

## 2016-08-15 MED ORDER — BSS IO SOLN
INTRAOCULAR | Status: DC | PRN
  Administered 2016-08-15: 15 mL

## 2016-08-15 MED ORDER — BSS IO SOLN
INTRAOCULAR | Status: DC | PRN
  Administered 2016-08-15: 500 mL

## 2016-08-15 MED ORDER — PHENYLEPHRINE HCL 2.5 % OP SOLN
1.0000 [drp] | OPHTHALMIC | Status: AC
  Administered 2016-08-15 (×3): 1 [drp] via OPHTHALMIC

## 2016-08-15 MED ORDER — TETRACAINE HCL 0.5 % OP SOLN
1.0000 [drp] | OPHTHALMIC | Status: AC
  Administered 2016-08-15 (×3): 1 [drp] via OPHTHALMIC

## 2016-08-15 MED ORDER — EPINEPHRINE PF 1 MG/ML IJ SOLN
INTRAMUSCULAR | Status: AC
  Filled 2016-08-15: qty 1

## 2016-08-15 MED ORDER — TETRACAINE 0.5 % OP SOLN OPTIME - NO CHARGE
OPHTHALMIC | Status: DC | PRN
  Administered 2016-08-15: 2 [drp] via OPHTHALMIC

## 2016-08-15 MED ORDER — CYCLOPENTOLATE-PHENYLEPHRINE 0.2-1 % OP SOLN
1.0000 [drp] | OPHTHALMIC | Status: AC
  Administered 2016-08-15 (×3): 1 [drp] via OPHTHALMIC

## 2016-08-15 MED ORDER — KETOROLAC TROMETHAMINE 0.5 % OP SOLN
1.0000 [drp] | OPHTHALMIC | Status: AC
  Administered 2016-08-15 (×3): 1 [drp] via OPHTHALMIC

## 2016-08-15 MED ORDER — PROVISC 10 MG/ML IO SOLN
INTRAOCULAR | Status: DC | PRN
  Administered 2016-08-15: 0.85 mL via INTRAOCULAR

## 2016-08-15 MED ORDER — MIDAZOLAM HCL 2 MG/2ML IJ SOLN
1.0000 mg | INTRAMUSCULAR | Status: DC | PRN
  Administered 2016-08-15 (×2): 2 mg via INTRAVENOUS
  Filled 2016-08-15 (×2): qty 2

## 2016-08-15 MED ORDER — FENTANYL CITRATE (PF) 100 MCG/2ML IJ SOLN
25.0000 ug | INTRAMUSCULAR | Status: AC | PRN
  Administered 2016-08-15 (×2): 25 ug via INTRAVENOUS
  Filled 2016-08-15: qty 2

## 2016-08-15 MED ORDER — LACTATED RINGERS IV SOLN
INTRAVENOUS | Status: DC
  Administered 2016-08-15: 10:00:00 via INTRAVENOUS

## 2016-08-15 SURGICAL SUPPLY — 9 items
CLOTH BEACON ORANGE TIMEOUT ST (SAFETY) ×2 IMPLANT
EYE SHIELD UNIVERSAL CLEAR (GAUZE/BANDAGES/DRESSINGS) ×2 IMPLANT
GLOVE ECLIPSE 6.5 STRL STRAW (GLOVE) ×2 IMPLANT
GLOVE EXAM NITRILE MD LF STRL (GLOVE) ×2 IMPLANT
LENS ALC ACRYL/TECN (Ophthalmic Related) ×3 IMPLANT
PAD ARMBOARD 7.5X6 YLW CONV (MISCELLANEOUS) ×2 IMPLANT
TAPE SURG TRANSPORE 1 IN (GAUZE/BANDAGES/DRESSINGS) IMPLANT
TAPE SURGICAL TRANSPORE 1 IN (GAUZE/BANDAGES/DRESSINGS) ×2
WATER STERILE IRR 250ML POUR (IV SOLUTION) ×2 IMPLANT

## 2016-08-15 NOTE — Op Note (Signed)
Patient brought to the operating room and prepped and draped in the usual manner. Lid speculum inserted in left eye. Stab incision made at the twelve o'clock position. Provisc instilled in the anterior chamber. A 2.4 mm. Stab incision was made temporally. An anterior capsulotomy was done with a bent 25 gauge needle. The nucleus was hydrodissected. The Phaco tip was inserted in the anterior chamber and the nucleus was emulsified. CDE was 7.94. The cortical material was then removed with the I and A tip. Posterior capsule was the polished. The anterior chamber was deepened with Provisc. A 13.0 Diopter Alcon AU00T0 IOL was then inserted in the capsular bag. Provisc was then removed with the I and A tip. The wound was then hydrated. Patient sent to the Recovery Room in good condition with follow up in my office.  Preoperative Diagnosis: Nuclear Cataract OS  Postoperative Diagnosis: Same  Procedure name: Kelman Phacoemulsification OS with IOL

## 2016-08-15 NOTE — Discharge Instructions (Signed)
°  °          [facility] Instructions °[address]- [place] [address]-[place] °    ° °1. Avoid closing eyes tightly. One often closes the eye tightly when laughing, talking, sneezing, coughing or if they feel irritated. At these times, you should be careful not to close your eyes tightly. ° °2. Instill eye drops as instructed. To instill drops in your eye, open it, look up and have someone gently pull the lower lid down and instill a couple of drops inside the lower lid. ° °3. Do not touch upper lid. ° °4. Take Advil or Tylenol for pain. ° °5. You may use either eye for near work, such as reading or sewing and you may watch television. ° °6. You may have your hair done at the beauty parlor at any time. ° °7. Wear dark glasses with or without your own glasses if you are in bright light. ° °8. Call our office at [phone] or [phone] if you have sharp pain in your eye or unusual symptoms. ° °9.  FOLLOW UP WITH DR. SHAPIRO TODAY IN HIS [place] OFFICE AT 2:45pm. ° °  °I have received a copy of the above instructions and will follow them.  ° ° ° °IF YOU ARE IN IMMEDIATE DANGER CALL 911! ° °It is important for you to keep your follow-up appointment with your physician after discharge, OR, for you /your caregiver to make a follow-up appointment with your physician / medical provider after discharge. ° °Show these instructions to the next healthcare provider you see. ° °

## 2016-08-15 NOTE — Transfer of Care (Signed)
Immediate Anesthesia Transfer of Care Note  Patient: Greg Palmer  Procedure(s) Performed: Procedure(s) (LRB): CATARACT EXTRACTION PHACO AND INTRAOCULAR LENS PLACEMENT (IOC) (Right)  Patient Location: Shortstay  Anesthesia Type: MAC  Level of Consciousness: awake  Airway & Oxygen Therapy: Patient Spontanous Breathing   Post-op Assessment: Report given to PACU RN, Post -op Vital signs reviewed and stable and Patient moving all extremities  Post vital signs: Reviewed and stable  Complications: No apparent anesthesia complications

## 2016-08-15 NOTE — Op Note (Signed)
Patient brought to the operating room and prepped and draped in the usual manner.  Lid speculum inserted in right eye.  Stab incision made at the twelve o'clock position.  Provisc instilled in the anterior chamber.   A 2.4 mm. Stab incision was made temporally.  An anterior capsulotomy was done with a bent 25 gauge needle.  The nucleus was hydrodissected.  The Phaco tip was inserted in the anterior chamber and the nucleus was emulsified.  CDE was 8.32.  The cortical material was then removed with the I and A tip.  Posterior capsule was the polished.  The anterior chamber was deepened with Provisc.  A 19.0 Diopter Alcon AU00T0 IOL was then inserted in the capsular bag.  Provisc was then removed with the I and A tip.  The wound was then hydrated.  Patient sent to the Recovery Room in good condition with follow up in my office.  Preoperative Diagnosis:  Nuclear Cataract OD Postoperative Diagnosis:  Same Procedure name: Kelman Phacoemulsification OD with IOL

## 2016-08-15 NOTE — H&P (Signed)
The patient was re examined and there is no change in the patients condition since the original H and P. 

## 2016-08-15 NOTE — Anesthesia Procedure Notes (Signed)
Procedure Name: MAC Date/Time: 08/15/2016 10:07 AM Performed by: Franco NonesYATES, Hallelujah Wysong S Pre-anesthesia Checklist: Patient identified, Emergency Drugs available, Suction available, Timeout performed and Patient being monitored Patient Re-evaluated:Patient Re-evaluated prior to inductionOxygen Delivery Method: Nasal Cannula

## 2016-08-15 NOTE — Anesthesia Postprocedure Evaluation (Signed)
  Anesthesia Post-op Note  Patient: Willow OraMichael Whiting  Procedure(s) Performed: Procedure(s) (LRB): CATARACT EXTRACTION PHACO AND INTRAOCULAR LENS PLACEMENT (IOC) (Right)  Patient Location:  Short Stay  Anesthesia Type: MAC  Level of Consciousness: awake  Airway and Oxygen Therapy: Patient Spontanous Breathing  Post-op Pain: none  Post-op Assessment: Post-op Vital signs reviewed, Patient's Cardiovascular Status Stable, Respiratory Function Stable, Patent Airway, No signs of Nausea or vomiting and Pain level controlled  Post-op Vital Signs: Reviewed and stable  Complications: No apparent anesthesia complications Anesthesia Post Note  Patient: Willow OraMichael Deemer  Procedure(s) Performed: Procedure(s) (LRB): CATARACT EXTRACTION PHACO AND INTRAOCULAR LENS PLACEMENT (IOC) (Right)  Anesthesia Post Evaluation  Last Vitals:  Vitals:   08/15/16 0940 08/15/16 1000  BP: 136/88 (!) 155/98  Pulse:    Resp: (!) 40 (!) 31  Temp:      Last Pain:  Vitals:   08/15/16 0935  TempSrc: Oral                 Idelia Caudell

## 2016-08-15 NOTE — Anesthesia Preprocedure Evaluation (Signed)
Anesthesia Evaluation  Patient identified by MRN, date of birth, ID band Patient awake    Reviewed: Allergy & Precautions, NPO status , Patient's Chart, lab work & pertinent test results  Airway Mallampati: II  TM Distance: >3 FB     Dental  (+) Poor Dentition   Pulmonary COPD, former smoker,    breath sounds clear to auscultation       Cardiovascular hypertension, Pt. on medications  Rhythm:Regular Rate:Normal     Neuro/Psych    GI/Hepatic negative GI ROS, Neg liver ROS,   Endo/Other    Renal/GU      Musculoskeletal   Abdominal   Peds  Hematology   Anesthesia Other Findings   Reproductive/Obstetrics                             Anesthesia Physical Anesthesia Plan  ASA: III  Anesthesia Plan: MAC   Post-op Pain Management:    Induction: Intravenous  Airway Management Planned: Nasal Cannula  Additional Equipment:   Intra-op Plan:   Post-operative Plan:   Informed Consent: I have reviewed the patients History and Physical, chart, labs and discussed the procedure including the risks, benefits and alternatives for the proposed anesthesia with the patient or authorized representative who has indicated his/her understanding and acceptance.     Plan Discussed with:   Anesthesia Plan Comments:         Anesthesia Quick Evaluation  

## 2016-08-17 ENCOUNTER — Encounter (HOSPITAL_COMMUNITY): Payer: Self-pay | Admitting: Ophthalmology

## 2016-08-19 DIAGNOSIS — Z23 Encounter for immunization: Secondary | ICD-10-CM | POA: Diagnosis not present

## 2017-02-25 ENCOUNTER — Emergency Department (HOSPITAL_COMMUNITY)
Admission: EM | Admit: 2017-02-25 | Discharge: 2017-02-25 | Disposition: A | Discharge status: Home / Self Care | Payer: Medicare Other | Attending: Emergency Medicine | Admitting: Emergency Medicine

## 2017-02-25 ENCOUNTER — Encounter (HOSPITAL_COMMUNITY): Payer: Self-pay | Admitting: *Deleted

## 2017-02-25 DIAGNOSIS — I10 Essential (primary) hypertension: Secondary | ICD-10-CM | POA: Diagnosis not present

## 2017-02-25 DIAGNOSIS — Z7982 Long term (current) use of aspirin: Secondary | ICD-10-CM | POA: Insufficient documentation

## 2017-02-25 DIAGNOSIS — Z7984 Long term (current) use of oral hypoglycemic drugs: Secondary | ICD-10-CM | POA: Diagnosis not present

## 2017-02-25 DIAGNOSIS — J449 Chronic obstructive pulmonary disease, unspecified: Secondary | ICD-10-CM | POA: Insufficient documentation

## 2017-02-25 DIAGNOSIS — Z23 Encounter for immunization: Secondary | ICD-10-CM | POA: Diagnosis not present

## 2017-02-25 DIAGNOSIS — Y939 Activity, unspecified: Secondary | ICD-10-CM | POA: Diagnosis not present

## 2017-02-25 DIAGNOSIS — S6992XA Unspecified injury of left wrist, hand and finger(s), initial encounter: Secondary | ICD-10-CM | POA: Diagnosis not present

## 2017-02-25 DIAGNOSIS — S60455A Superficial foreign body of left ring finger, initial encounter: Secondary | ICD-10-CM | POA: Diagnosis not present

## 2017-02-25 DIAGNOSIS — Y929 Unspecified place or not applicable: Secondary | ICD-10-CM | POA: Diagnosis not present

## 2017-02-25 DIAGNOSIS — W228XXA Striking against or struck by other objects, initial encounter: Secondary | ICD-10-CM | POA: Insufficient documentation

## 2017-02-25 DIAGNOSIS — Y999 Unspecified external cause status: Secondary | ICD-10-CM | POA: Insufficient documentation

## 2017-02-25 DIAGNOSIS — Z87891 Personal history of nicotine dependence: Secondary | ICD-10-CM | POA: Insufficient documentation

## 2017-02-25 MED ORDER — POVIDONE-IODINE 10 % EX SOLN
CUTANEOUS | Status: AC
  Filled 2017-02-25: qty 118

## 2017-02-25 MED ORDER — TETANUS-DIPHTH-ACELL PERTUSSIS 5-2.5-18.5 LF-MCG/0.5 IM SUSP
0.5000 mL | Freq: Once | INTRAMUSCULAR | Status: AC
  Administered 2017-02-25: 0.5 mL via INTRAMUSCULAR
  Filled 2017-02-25: qty 0.5

## 2017-02-25 MED ORDER — LIDOCAINE HCL (PF) 1 % IJ SOLN
INTRAMUSCULAR | Status: AC
  Filled 2017-02-25: qty 5

## 2017-02-25 MED ORDER — LIDOCAINE HCL (PF) 1 % IJ SOLN
5.0000 mL | Freq: Once | INTRAMUSCULAR | Status: AC
  Administered 2017-02-25: 5 mL via INTRADERMAL

## 2017-02-25 NOTE — Discharge Instructions (Signed)
Return is nay sign of infection or any problems.

## 2017-02-25 NOTE — ED Notes (Signed)
ED Provider at bedside. 

## 2017-02-25 NOTE — ED Provider Notes (Signed)
[facility] Provider Note   CSN: [id number] Arrival date & time: 02/25/17  0902     History   Chief Complaint Chief Complaint  Patient presents with  . Finger Injury    HPI Greg Palmer is a 66 y.o. male.  Pt has a fishhook stuck in left ring finger.  No other complaints.  Last tetanus unknown.  Pt reports some discomfort   The history is provided by the patient. No language interpreter was used.  Hand Pain  This is a new problem. The problem occurs constantly. Nothing aggravates the symptoms. Nothing relieves the symptoms. He has tried nothing for the symptoms. The treatment provided no relief.    Past Medical History:  Diagnosis Date  . Chronic shoulder pain   . COPD (chronic obstructive pulmonary disease) (HCC)   . Hyperlipidemia   . Hypertension     Patient Active Problem List   Diagnosis Date Noted  . Acute respiratory failure with hypoxemia (HCC) 02/15/2016  . Acute bronchitis 02/15/2016  . Elevated brain natriuretic peptide (BNP) level 02/15/2016  . Constipation 02/15/2016  . COPD with exacerbation (HCC) 02/14/2016  . HLD (hyperlipidemia) 02/14/2016  . HTN (hypertension) 02/14/2016  . COPD with acute exacerbation (HCC) 02/14/2016  . COPD exacerbation (HCC) 02/14/2016    Past Surgical History:  Procedure Laterality Date  . CATARACT EXTRACTION W/PHACO Left 08/01/2016   Procedure: CATARACT EXTRACTION PHACO AND INTRAOCULAR LENS PLACEMENT (IOC);  Surgeon: Jethro BolusMark Shapiro, MD;  Location: [facility];  Service: Ophthalmology;  Laterality: Left;  CDE: 29.22  . CATARACT EXTRACTION W/PHACO Right 08/15/2016   Procedure: CATARACT EXTRACTION PHACO AND INTRAOCULAR LENS PLACEMENT (IOC);  Surgeon: Jethro BolusMark Shapiro, MD;  Location: [facility];  Service: Ophthalmology;  Laterality: Right;  CDE: 8.32       Home Medications    Prior to Admission medications   Medication Sig Start Date End Date Taking? Authorizing Provider  albuterol (PROVENTIL HFA;VENTOLIN HFA) 108 (90 BASE)  MCG/ACT inhaler Inhale 2 puffs into the lungs every 4 (four) hours as needed for wheezing. 02/09/13   Cathren LaineSteinl, Kevin, MD  albuterol (PROVENTIL) (2.5 MG/3ML) 0.083% nebulizer solution Inhale 2.5 mg into the lungs every 6 (six) hours as needed for shortness of breath, wheezing or cough. 06/27/16   [provider]  aspirin EC 81 MG tablet Take 81 mg by mouth at bedtime.     [provider]  CINNAMON PO Take 1,000 mg by mouth daily at 2 PM.    [provider]  guaiFENesin (MUCINEX) 600 MG 12 hr tablet Take 1 tablet (600 mg total) by mouth 2 (two) times daily. 02/17/16   Leatha GildingGherghe, Costin M, MD  levofloxacin (LEVAQUIN) 750 MG tablet Take 1 tablet (750 mg total) by mouth daily. Patient not taking: Reported on 07/27/2016 02/17/16   Leatha GildingGherghe, Costin M, MD  lisinopril (PRINIVIL,ZESTRIL) 5 MG tablet Take 1 tablet by mouth at bedtime.  02/07/16   [provider]  metFORMIN (GLUCOPHAGE) 500 MG tablet Take 1 tablet (500 mg total) by mouth 2 (two) times daily with a meal. Patient not taking: Reported on 07/27/2016 02/17/16   Leatha GildingGherghe, Costin M, MD  Multiple Vitamins-Minerals (MULTIVITAMINS THER. W/MINERALS) TABS tablet Take 1 tablet by mouth at bedtime.     [provider]  Omega-3 Fatty Acids (FISH OIL) 1000 MG CAPS Take 1 capsule by mouth at bedtime.     [provider]  ondansetron (ZOFRAN ODT) 8 MG disintegrating tablet 8mg  ODT q4 hours prn nausea Patient not taking: Reported on  07/27/2016 02/09/16   Zadie Rhine, MD  predniSONE (DELTASONE) 20 MG tablet Take 1.5 tablets (30 mg total) by mouth daily with breakfast. 30 mg daily x 2 days then 20 mg daily x 2 days then 10 mg daily x 2 days Patient not taking: Reported on 07/27/2016 02/17/16   Leatha Gilding, MD    Family History No family history on file.  Social History Social History  Substance Use Topics  . Smoking status: Former Smoker    Packs/day: 0.50    Years: 4.00    Types: Cigarettes    Quit date:  07/28/1970  . Smokeless tobacco: Never Used  . Alcohol use 0.6 oz/week    1 Glasses of wine per week     Comment: occasionally     Allergies   Cats claw [uncaria tomentosa (cats claw)]   Review of Systems Review of Systems  All other systems reviewed and are negative.    Physical Exam Updated Vital Signs BP (!) 164/99   Pulse 88   Temp 97.9 F (36.6 C) (Oral)   Resp 16   Ht 5\' 11"  (1.803 m)   Wt 121.6 kg (268 lb)   SpO2 99%   BMI 37.38 kg/m   Physical Exam  Constitutional: He appears well-developed and well-nourished.  Musculoskeletal: Normal range of motion.  Fishhook in ring finger in fatpad palmar aspect of finger  Neurological: He is alert.  Skin: Skin is warm.  Psychiatric: He has a normal mood and affect.  Nursing note and vitals reviewed.    ED Treatments / Results  Labs (all labs ordered are listed, but only abnormal results are displayed) Labs Reviewed - No data to display  EKG  EKG Interpretation None       Radiology No results found.  Procedures .Foreign Body Removal Date/Time: 02/25/2017 9:39 AM Performed by: Elson Areas Authorized by: Elson Areas  Consent: Verbal consent obtained. Risks and benefits: risks, benefits and alternatives were discussed Consent given by: patient Time out: Immediately prior to procedure a "time out" was called to verify the correct patient, procedure, equipment, support staff and site/side marked as required. Body area: skin Anesthesia: local infiltration  Anesthesia: Local Anesthetic: lidocaine 2% without epinephrine Anesthetic total: 2 mL  Sedation: Patient sedated: no Patient restrained: no Complexity: simple 1 objects recovered. Objects recovered: 1 Post-procedure assessment: foreign body removed Patient tolerance: Patient tolerated the procedure well with no immediate complications Comments: Fish hook removed using sting release method.  Wound soaked.  Pt given tetanus, pt advied to  watch for any sign of infection   (including critical care time)  Medications Ordered in ED Medications  lidocaine (PF) (XYLOCAINE) 1 % injection (not administered)  povidone-iodine (BETADINE) 10 % external solution (not administered)  Tdap (BOOSTRIX) injection 0.5 mL (0.5 mLs Intramuscular Given 02/25/17 0927)  lidocaine (PF) (XYLOCAINE) 1 % injection 5 mL (5 mLs Intradermal Given 02/25/17 1610)     Initial Impression / Assessment and Plan / ED Course  I have reviewed the triage vital signs and the nursing notes.  Pertinent labs & imaging results that were available during my care of the patient were reviewed by me and considered in my medical decision making (see chart for details).       Final Clinical Impressions(s) / ED Diagnoses   Final diagnoses:  Fishhook injury to finger, left, initial encounter    New Prescriptions New Prescriptions   No medications on file  An After Visit Summary was printed and  given to the patient.   Elson Areas, [place] 02/25/17 4098    Marily Memos, MD 02/25/17 [phone]

## 2017-02-25 NOTE — ED Triage Notes (Signed)
Pt c/o fish hook caught in left ring finger that happened this morning. Unknown last tetanus. No bleeding noted.

## 2017-07-20 DIAGNOSIS — Z23 Encounter for immunization: Secondary | ICD-10-CM | POA: Diagnosis not present

## 2017-09-04 DIAGNOSIS — E6609 Other obesity due to excess calories: Secondary | ICD-10-CM | POA: Diagnosis not present

## 2017-09-04 DIAGNOSIS — Z0000 Encounter for general adult medical examination without abnormal findings: Secondary | ICD-10-CM | POA: Diagnosis not present

## 2017-09-04 DIAGNOSIS — E785 Hyperlipidemia, unspecified: Secondary | ICD-10-CM | POA: Diagnosis not present

## 2017-09-04 DIAGNOSIS — Z6835 Body mass index (BMI) 35.0-35.9, adult: Secondary | ICD-10-CM | POA: Diagnosis not present

## 2017-09-04 DIAGNOSIS — R7309 Other abnormal glucose: Secondary | ICD-10-CM | POA: Diagnosis not present

## 2017-09-04 DIAGNOSIS — Z1389 Encounter for screening for other disorder: Secondary | ICD-10-CM | POA: Diagnosis not present

## 2017-10-14 IMAGING — DX DG CHEST 2V
2 series · 2 of 2 positions shown · non-contrast
Comparison: Chest radiograph performed 10/14/2013

CLINICAL DATA: Acute onset of cough and shortness of breath.
Initial encounter.

EXAM:
CHEST  2 VIEW

[chest pa]
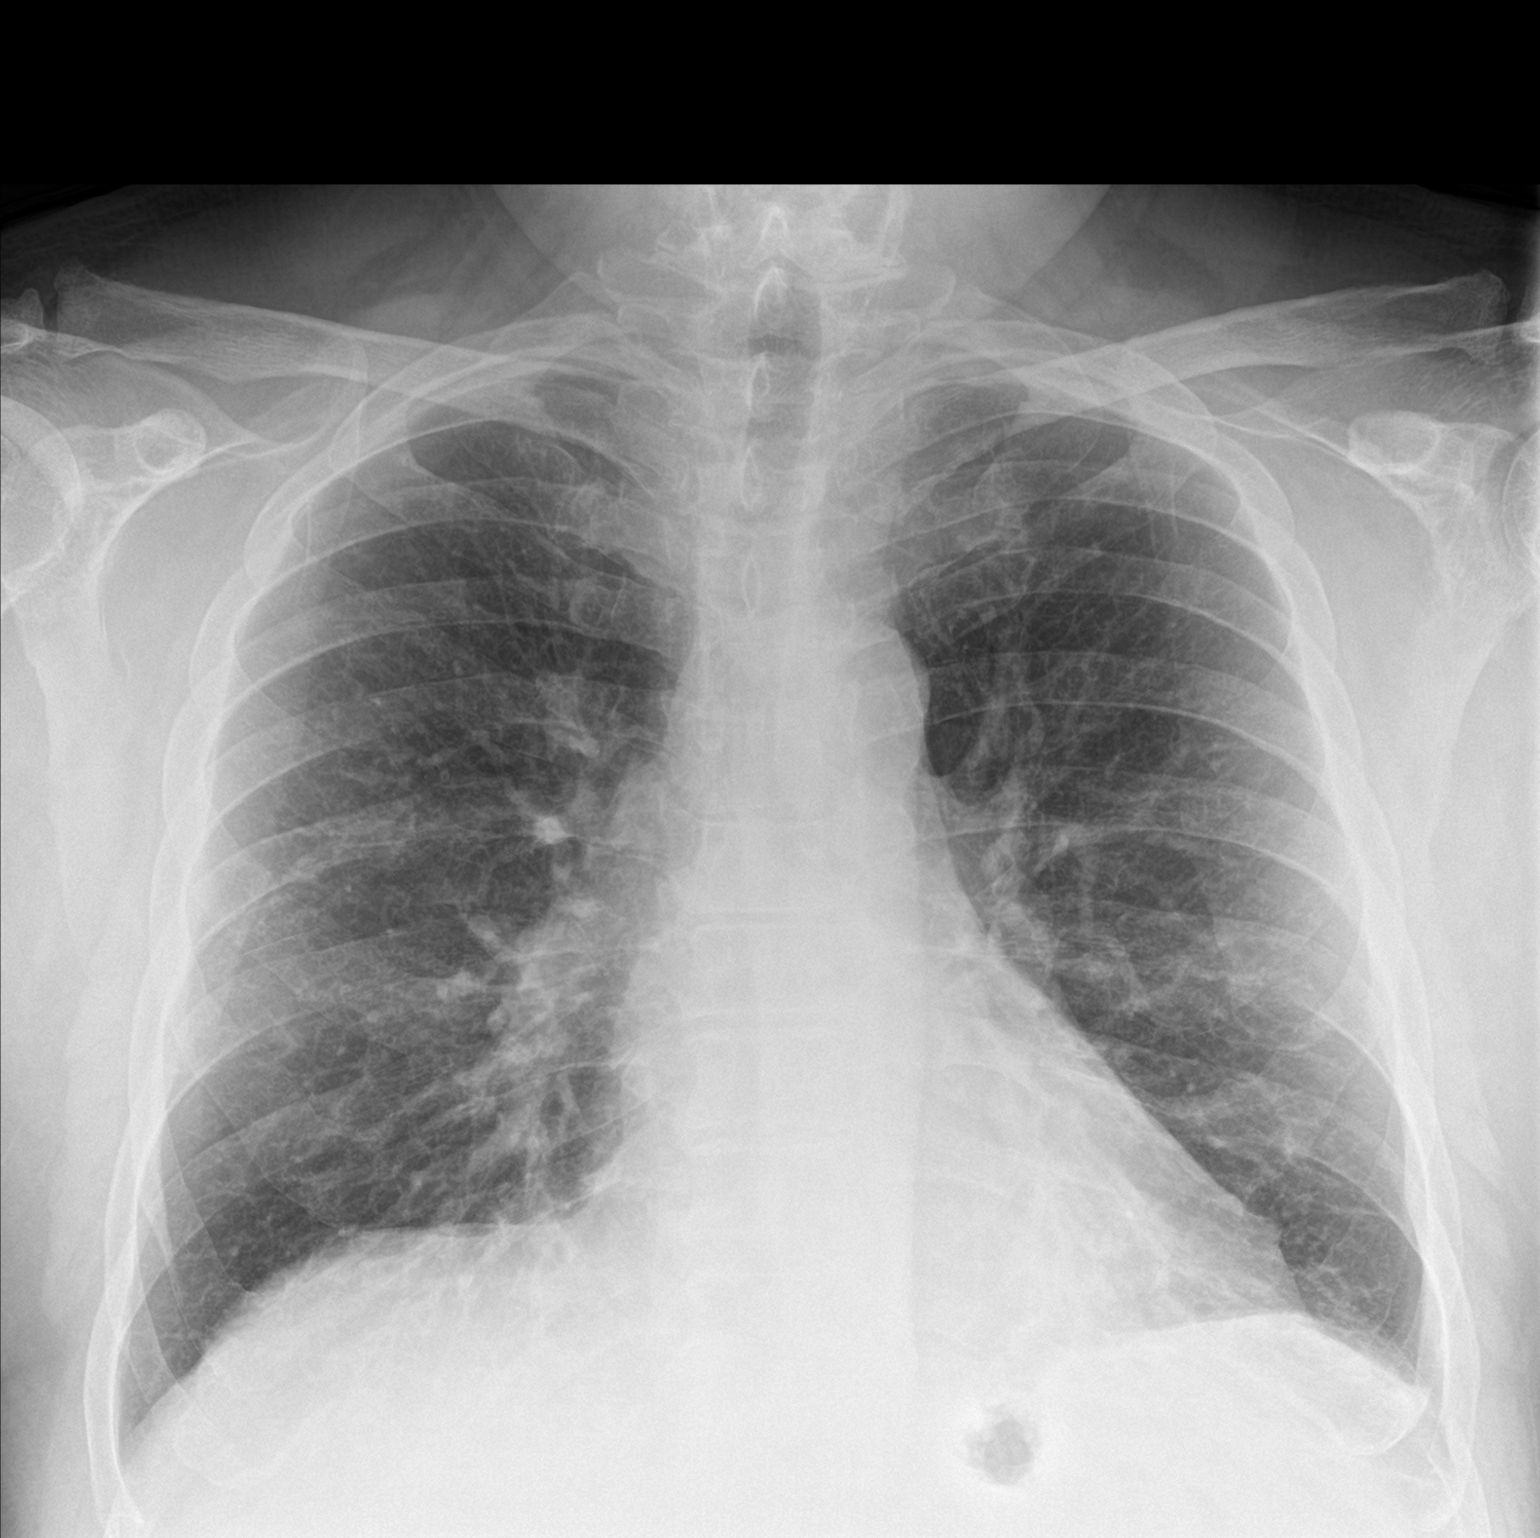

[chest lat]
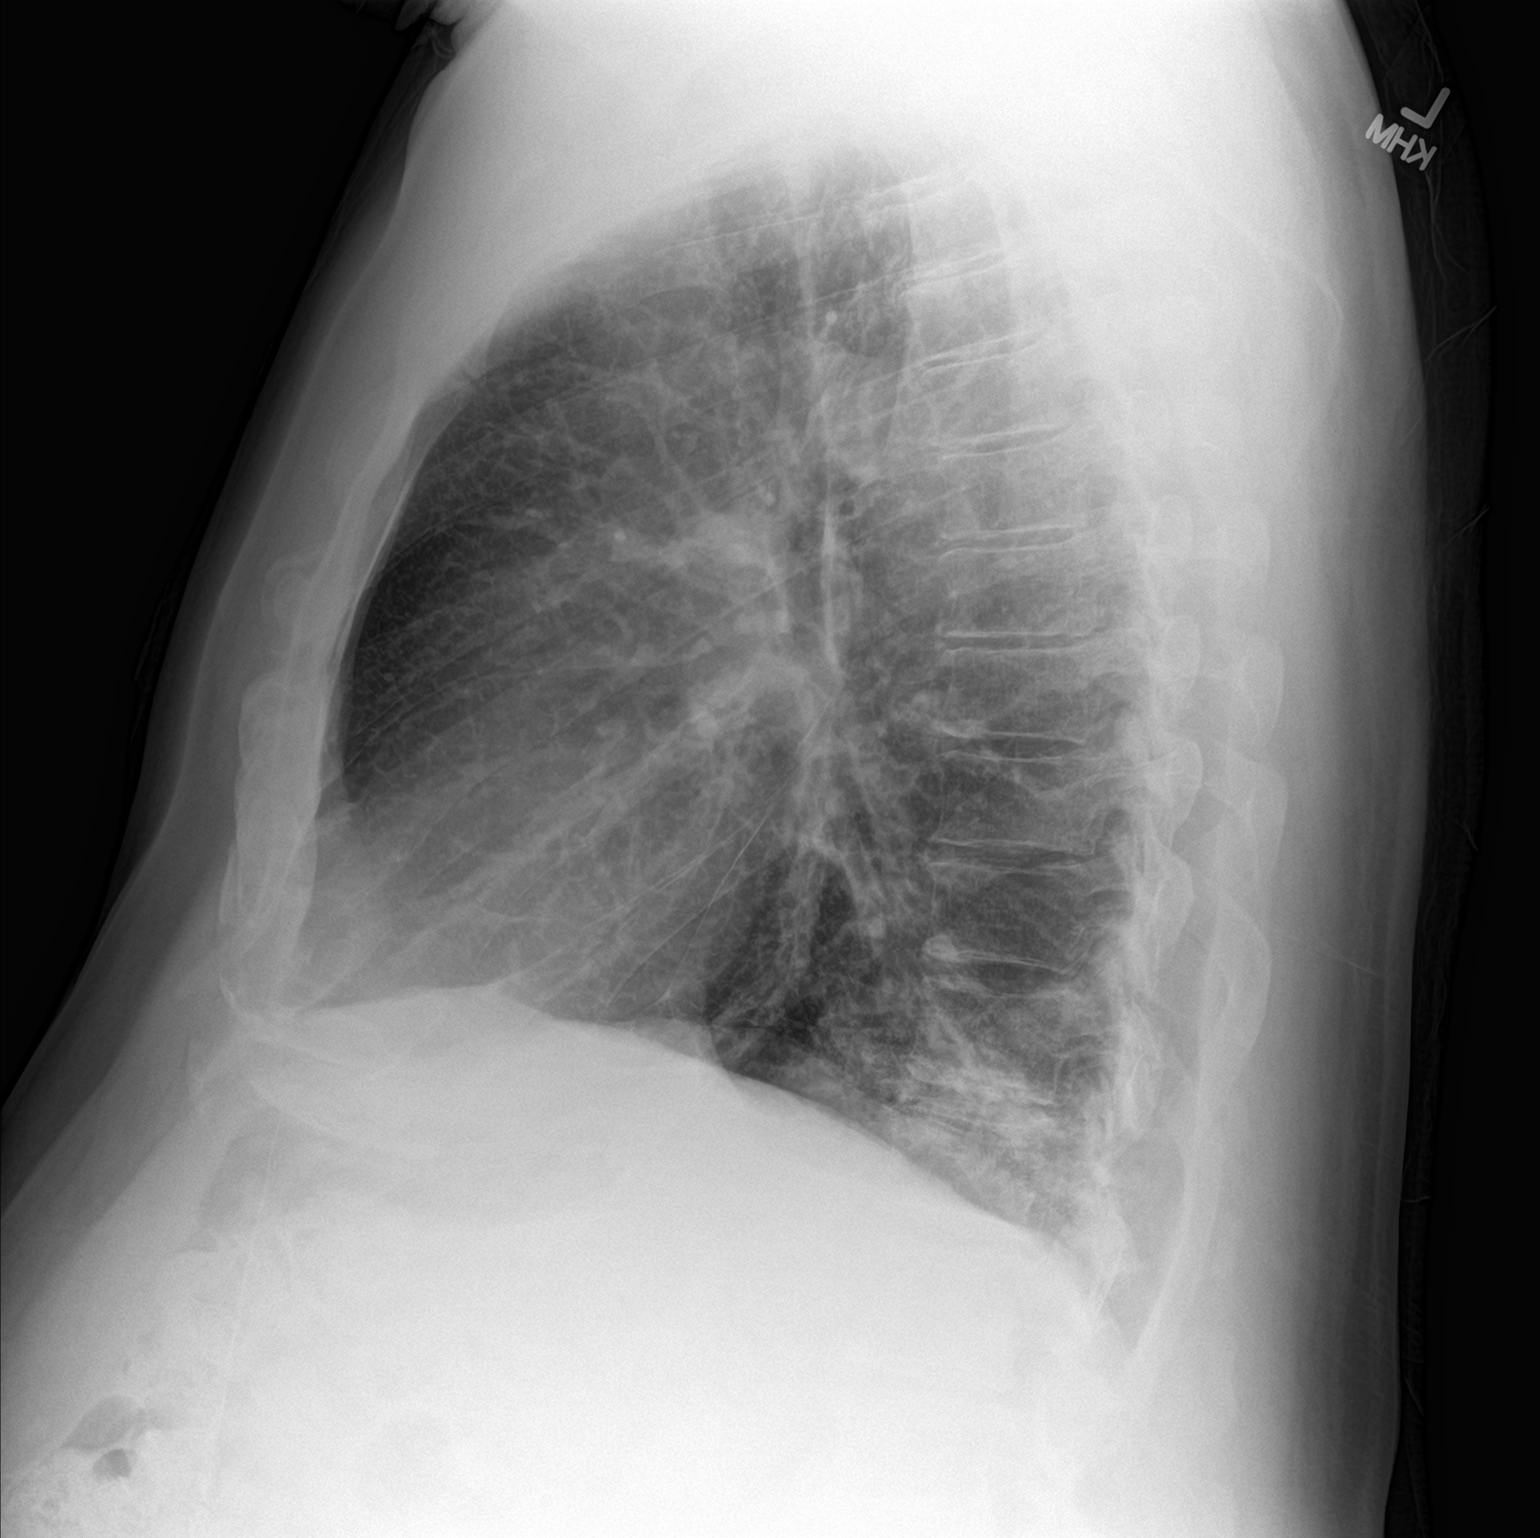

[2 of 2 positions shown; findings below may reference images not displayed]

FINDINGS: The lungs are well-aerated. Vascular congestion is noted. Mild left
basilar opacity may reflect mild pneumonia or possibly mild
asymmetric interstitial edema. There is no evidence of pleural
effusion or pneumothorax.

The heart is normal in size; the mediastinal contour is within
normal limits. No acute osseous abnormalities are seen.
IMPRESSION: Vascular congestion noted. Mild left basilar airspace opacity may
reflect mild pneumonia or possibly mild asymmetric interstitial
edema.

## 2017-11-20 ENCOUNTER — Other Ambulatory Visit: Payer: Self-pay

## 2017-11-20 ENCOUNTER — Encounter (HOSPITAL_COMMUNITY): Payer: Self-pay | Admitting: Emergency Medicine

## 2017-11-20 ENCOUNTER — Emergency Department (HOSPITAL_COMMUNITY)
Admission: EM | Admit: 2017-11-20 | Discharge: 2017-11-20 | Disposition: A | Discharge status: Home / Self Care | Payer: Medicare Other | Attending: Emergency Medicine | Admitting: Emergency Medicine

## 2017-11-20 ENCOUNTER — Emergency Department (HOSPITAL_COMMUNITY): Payer: Medicare Other

## 2017-11-20 DIAGNOSIS — Z87891 Personal history of nicotine dependence: Secondary | ICD-10-CM | POA: Insufficient documentation

## 2017-11-20 DIAGNOSIS — R0602 Shortness of breath: Secondary | ICD-10-CM | POA: Diagnosis not present

## 2017-11-20 DIAGNOSIS — R05 Cough: Secondary | ICD-10-CM | POA: Diagnosis not present

## 2017-11-20 DIAGNOSIS — Z79899 Other long term (current) drug therapy: Secondary | ICD-10-CM | POA: Insufficient documentation

## 2017-11-20 DIAGNOSIS — I10 Essential (primary) hypertension: Secondary | ICD-10-CM | POA: Diagnosis not present

## 2017-11-20 DIAGNOSIS — Z7982 Long term (current) use of aspirin: Secondary | ICD-10-CM | POA: Diagnosis not present

## 2017-11-20 DIAGNOSIS — J441 Chronic obstructive pulmonary disease with (acute) exacerbation: Secondary | ICD-10-CM | POA: Diagnosis not present

## 2017-11-20 LAB — URINALYSIS, ROUTINE W REFLEX MICROSCOPIC
BILIRUBIN URINE: NEGATIVE
Glucose, UA: NEGATIVE mg/dL
Hgb urine dipstick: NEGATIVE
Ketones, ur: NEGATIVE mg/dL
Leukocytes, UA: NEGATIVE
NITRITE: NEGATIVE
PH: 6 (ref 5.0–8.0)
Protein, ur: NEGATIVE mg/dL
SPECIFIC GRAVITY, URINE: 1.024 (ref 1.005–1.030)

## 2017-11-20 LAB — COMPREHENSIVE METABOLIC PANEL
ALBUMIN: 3.8 g/dL (ref 3.5–5.0)
ALT: 25 U/L (ref 17–63)
ANION GAP: 10 (ref 5–15)
AST: 20 U/L (ref 15–41)
Alkaline Phosphatase: 72 U/L (ref 38–126)
BUN: 15 mg/dL (ref 6–20)
CALCIUM: 9 mg/dL (ref 8.9–10.3)
CO2: 25 mmol/L (ref 22–32)
Chloride: 102 mmol/L (ref 101–111)
Creatinine, Ser: 1.05 mg/dL (ref 0.61–1.24)
GFR calc non Af Amer: >60 mL/min (ref >60)
GLUCOSE: 129 mg/dL — AB (ref 65–99)
POTASSIUM: 4.3 mmol/L (ref 3.5–5.1)
SODIUM: 137 mmol/L (ref 135–145)
TOTAL PROTEIN: 7.8 g/dL (ref 6.5–8.1)
Total Bilirubin: 0.4 mg/dL (ref 0.3–1.2)

## 2017-11-20 LAB — CBC
HEMATOCRIT: 44.3 % (ref 39.0–52.0)
HEMOGLOBIN: 14.6 g/dL (ref 13.0–17.0)
MCH: 30.2 pg (ref 26.0–34.0)
MCHC: 33 g/dL (ref 30.0–36.0)
MCV: 91.5 fL (ref 78.0–100.0)
Platelets: 174 10*3/uL (ref 150–400)
RBC: 4.84 MIL/uL (ref 4.22–5.81)
RDW: 13.2 % (ref 11.5–15.5)
WBC: 11.9 10*3/uL — ABNORMAL HIGH (ref 4.0–10.5)

## 2017-11-20 LAB — TROPONIN I: TROPONIN I: 0.03 ng/mL — AB (ref <0.03)

## 2017-11-20 LAB — LIPASE, BLOOD: Lipase: 24 U/L (ref 11–51)

## 2017-11-20 LAB — I-STAT TROPONIN, ED: TROPONIN I, POC: 0.01 ng/mL (ref 0.00–0.08)

## 2017-11-20 MED ORDER — IPRATROPIUM BROMIDE 0.02 % IN SOLN
1.0000 mg | Freq: Once | RESPIRATORY_TRACT | Status: AC
  Administered 2017-11-20: 1 mg via RESPIRATORY_TRACT
  Filled 2017-11-20: qty 5

## 2017-11-20 MED ORDER — DOXYCYCLINE HYCLATE 100 MG PO CAPS: 100.0000 mg | ORAL_CAPSULE | Freq: Two times a day (BID) | ORAL | 0 refills | Status: AC

## 2017-11-20 MED ORDER — ALBUTEROL SULFATE (2.5 MG/3ML) 0.083% IN NEBU: 5.0000 mg | INHALATION_SOLUTION | Freq: Once | RESPIRATORY_TRACT | Status: DC

## 2017-11-20 MED ORDER — ALBUTEROL (5 MG/ML) CONTINUOUS INHALATION SOLN
10.0000 mg/h | INHALATION_SOLUTION | Freq: Once | RESPIRATORY_TRACT | Status: AC
  Administered 2017-11-20: 10 mg/h via RESPIRATORY_TRACT
  Filled 2017-11-20: qty 20

## 2017-11-20 MED ORDER — METHYLPREDNISOLONE SODIUM SUCC 125 MG IJ SOLR
125.0000 mg | Freq: Once | INTRAMUSCULAR | Status: AC
  Administered 2017-11-20: 125 mg via INTRAVENOUS
  Filled 2017-11-20: qty 2

## 2017-11-20 MED ORDER — PREDNISONE 20 MG PO TABS: ORAL_TABLET | ORAL | 0 refills | Status: DC

## 2017-11-20 NOTE — ED Provider Notes (Signed)
At discharge patient was no longer wheezing.  He was ambulated around and his sats did not drop below 93%.  He is discharged home with prednisone and will continue the albuterol every 4-6 hours.  Patient is also put on doxycycline for this cough and yellow sputum production.  He will follow-up with his primary care doctor later this week   Bethann BerkshireZammit, Tinlee Navarrette, MD 11/20/17 1701

## 2017-11-20 NOTE — ED Provider Notes (Signed)
[facility] EMERGENCY DEPARTMENT Provider Note   CSN: [id number] Arrival date & time: 11/20/17  1138     History   Chief Complaint Chief Complaint  Patient presents with  . Shortness of Breath    HPI Greg Palmer is a 67 y.o. male.  HPI  Pt was seen at 1320.  Per pt, c/o gradual onset and worsening of persistent cough, wheezing and SOB for the past 1 week.  Has been using home MDI and nebs with transient relief. Pt states he was "exposed to people that had pneumonia" and is concerned regarding same.  Denies CP/palpitations, no back pain, no abd pain, no N/V/D, no fevers, no rash.    Past Medical History:  Diagnosis Date  . Chronic shoulder pain   . COPD (chronic obstructive pulmonary disease) (HCC)   . Hyperlipidemia   . Hypertension     Patient Active Problem List   Diagnosis Date Noted  . Acute respiratory failure with hypoxemia (HCC) 02/15/2016  . Acute bronchitis 02/15/2016  . Elevated brain natriuretic peptide (BNP) level 02/15/2016  . Constipation 02/15/2016  . COPD with exacerbation (HCC) 02/14/2016  . HLD (hyperlipidemia) 02/14/2016  . HTN (hypertension) 02/14/2016  . COPD with acute exacerbation (HCC) 02/14/2016  . COPD exacerbation (HCC) 02/14/2016    Past Surgical History:  Procedure Laterality Date  . CATARACT EXTRACTION W/PHACO Left 08/01/2016   Procedure: CATARACT EXTRACTION PHACO AND INTRAOCULAR LENS PLACEMENT (IOC);  Surgeon: Jethro Bolus, MD;  Location: [facility];  Service: Ophthalmology;  Laterality: Left;  CDE: 29.22  . CATARACT EXTRACTION W/PHACO Right 08/15/2016   Procedure: CATARACT EXTRACTION PHACO AND INTRAOCULAR LENS PLACEMENT (IOC);  Surgeon: Jethro Bolus, MD;  Location: [facility];  Service: Ophthalmology;  Laterality: Right;  CDE: 8.32       Home Medications    Prior to Admission medications   Medication Sig Start Date End Date Taking? Authorizing Provider  albuterol (PROVENTIL HFA;VENTOLIN HFA) 108 (90 BASE) MCG/ACT inhaler Inhale 2  puffs into the lungs every 4 (four) hours as needed for wheezing. 02/09/13   Cathren Laine, MD  albuterol (PROVENTIL) (2.5 MG/3ML) 0.083% nebulizer solution Inhale 2.5 mg into the lungs every 6 (six) hours as needed for shortness of breath, wheezing or cough. 06/27/16   [provider]  aspirin EC 81 MG tablet Take 81 mg by mouth at bedtime.     [provider]  CINNAMON PO Take 1,000 mg by mouth daily at 2 PM.    [provider]  guaiFENesin (MUCINEX) 600 MG 12 hr tablet Take 1 tablet (600 mg total) by mouth 2 (two) times daily. 02/17/16   Leatha Gilding, MD  levofloxacin (LEVAQUIN) 750 MG tablet Take 1 tablet (750 mg total) by mouth daily. Patient not taking: Reported on 07/27/2016 02/17/16   Leatha Gilding, MD  lisinopril (PRINIVIL,ZESTRIL) 5 MG tablet Take 1 tablet by mouth at bedtime.  02/07/16   [provider]  metFORMIN (GLUCOPHAGE) 500 MG tablet Take 1 tablet (500 mg total) by mouth 2 (two) times daily with a meal. Patient not taking: Reported on 07/27/2016 02/17/16   Leatha Gilding, MD  Multiple Vitamins-Minerals (MULTIVITAMINS THER. W/MINERALS) TABS tablet Take 1 tablet by mouth at bedtime.     [provider]  Omega-3 Fatty Acids (FISH OIL) 1000 MG CAPS Take 1 capsule by mouth at bedtime.     [provider]  ondansetron (ZOFRAN ODT) 8 MG disintegrating tablet 8mg  ODT q4 hours prn nausea Patient not taking: Reported  on 07/27/2016 02/09/16   Zadie RhineWickline, Donald, MD  predniSONE (DELTASONE) 20 MG tablet Take 1.5 tablets (30 mg total) by mouth daily with breakfast. 30 mg daily x 2 days then 20 mg daily x 2 days then 10 mg daily x 2 days Patient not taking: Reported on 07/27/2016 02/17/16   Leatha GildingGherghe, Costin M, MD    Family History History reviewed. No pertinent family history.  Social History Social History   Tobacco Use  . Smoking status: Former Smoker    Packs/day: 0.50    Years: 4.00    Pack years: 2.00    Types: Cigarettes    Last  attempt to quit: 07/28/1970    Years since quitting: 47.3  . Smokeless tobacco: Never Used  Substance Use Topics  . Alcohol use: Yes    Alcohol/week: 0.6 oz    Types: 1 Glasses of wine per week    Comment: occasionally  . Drug use: Yes    Types: Marijuana    Comment: 2 weeks ago     Allergies   Cats claw [uncaria tomentosa (cats claw)]   Review of Systems Review of Systems ROS: Statement: All systems negative except as marked or noted in the HPI; Constitutional: Negative for fever and chills. ; ; Eyes: Negative for eye pain, redness and discharge. ; ; ENMT: Negative for ear pain, hoarseness, nasal congestion, sinus pressure and sore throat. ; ; Cardiovascular: Negative for chest pain, palpitations, diaphoresis, and peripheral edema. ; ; Respiratory: +cough, wheezing, SOB. Negative for stridor. ; ; Gastrointestinal: Negative for nausea, vomiting, diarrhea, abdominal pain, blood in stool, hematemesis, jaundice and rectal bleeding. . ; ; Genitourinary: Negative for dysuria, flank pain and hematuria. ; ; Musculoskeletal: Negative for back pain and neck pain. Negative for swelling and trauma.; ; Skin: Negative for pruritus, rash, abrasions, blisters, bruising and skin lesion.; ; Neuro: Negative for headache, lightheadedness and neck stiffness. Negative for weakness, altered level of consciousness, altered mental status, extremity weakness, paresthesias, involuntary movement, seizure and syncope.       Physical Exam Updated Vital Signs BP (!) 157/89 (BP Location: Right Arm)   Pulse 94   Temp 98.8 F (37.1 C) (Oral)   Resp 18   Ht 5\' 11"  (1.803 m)   Wt 117 kg (258 lb)   SpO2 98%   BMI 35.98 kg/m    Patient Vitals for the past 24 hrs:  BP Temp Temp src Pulse Resp SpO2 Height Weight  11/20/17 1500 (!) 171/102 - - (!) 118 (!) 22 99 % - -  11/20/17 1430 (!) 176/101 - - 96 (!) 22 100 % - -  11/20/17 1400 (!) 154/95 - - 94 (!) 24 96 % - -  11/20/17 1146 (!) 157/89 98.8 F (37.1 C) Oral  94 18 98 % 5\' 11"  (1.803 m) 117 kg (258 lb)     Physical Exam 1325: Physical examination:  Nursing notes reviewed; Vital signs and O2 SAT reviewed;  Constitutional: Well developed, Well nourished, Well hydrated, In no acute distress; Head:  Normocephalic, atraumatic; Eyes: EOMI, PERRL, No scleral icterus; ENMT: Mouth and pharynx normal, Mucous membranes moist; Neck: Supple, Full range of motion, No lymphadenopathy; Cardiovascular: Regular rate and rhythm, No gallop; Respiratory: Breath sounds coarse & equal bilaterally, insp/exp wheezes bilat. No audible wheezing.  Speaking full sentences, frequent coughing. Mild tachypnea. Normal respiratory effort/excursion; Chest: Nontender, Movement normal; Abdomen: Soft, Nontender, Nondistended, Normal bowel sounds; Genitourinary: No CVA tenderness; Extremities: Pulses normal, No tenderness, No edema, No calf edema or  asymmetry.; Neuro: AA&Ox3, Major CN grossly intact.  Speech clear. No gross focal motor or sensory deficits in extremities.; Skin: Color normal, Warm, Dry.   ED Treatments / Results  Labs (all labs ordered are listed, but only abnormal results are displayed)   EKG  EKG Interpretation  Date/Time:  Tuesday November 20 2017 11:50:27 EST Ventricular Rate:  94 PR Interval:  142 QRS Duration: 96 QT Interval:  364 QTC Calculation: 455 R Axis:   3 Text Interpretation:  Normal sinus rhythm Incomplete right bundle branch block Septal infarct , age undetermined Baseline wander When compared with ECG of 02/14/2016 No significant change was found Confirmed by Samuel Jester [phone]) on 11/20/2017 1:52:28 PM       Radiology   Procedures Procedures (including critical care time)  Medications Ordered in ED Medications  albuterol (PROVENTIL) (2.5 MG/3ML) 0.083% nebulizer solution 5 mg (not administered)  albuterol (PROVENTIL,VENTOLIN) solution continuous neb (not administered)  ipratropium (ATROVENT) nebulizer solution 1 mg (not administered)    methylPREDNISolone sodium succinate (SOLU-MEDROL) 125 mg/2 mL injection 125 mg (125 mg Intravenous Given 11/20/17 1355)     Initial Impression / Assessment and Plan / ED Course  I have reviewed the triage vital signs and the nursing notes.  Pertinent labs & imaging results that were available during my care of the patient were reviewed by me and considered in my medical decision making (see chart for details).  MDM Reviewed: previous chart, nursing note and vitals Reviewed previous: labs and ECG Interpretation: labs, ECG and x-ray Total time providing critical care: 30-74 minutes. This excludes time spent performing separately reportable procedures and services.    CRITICAL CARE Performed by: Laray Anger Total critical care time: 35 minutes Critical care time was exclusive of separately billable procedures and treating other patients. Critical care was necessary to treat or prevent imminent or life-threatening deterioration. Critical care was time spent personally by me on the following activities: development of treatment plan with patient and/or surrogate as well as nursing, discussions with consultants, evaluation of patient's response to treatment, examination of patient, obtaining history from patient or surrogate, ordering and performing treatments and interventions, ordering and review of laboratory studies, ordering and review of radiographic studies, pulse oximetry and re-evaluation of patient's condition.  Results for orders placed or performed during the hospital encounter of 11/20/17  Lipase, blood  Result Value Ref Range   Lipase 24 11 - 51 U/L  Comprehensive metabolic panel  Result Value Ref Range   Sodium 137 135 - 145 mmol/L   Potassium 4.3 3.5 - 5.1 mmol/L   Chloride 102 101 - 111 mmol/L   CO2 25 22 - 32 mmol/L   Glucose, Bld 129 (H) 65 - 99 mg/dL   BUN 15 6 - 20 mg/dL   Creatinine, Ser 6.04 0.61 - 1.24 mg/dL   Calcium 9.0 8.9 - 54.0 mg/dL   Total Protein  7.8 6.5 - 8.1 g/dL   Albumin 3.8 3.5 - 5.0 g/dL   AST 20 15 - 41 U/L   ALT 25 17 - 63 U/L   Alkaline Phosphatase 72 38 - 126 U/L   Total Bilirubin 0.4 0.3 - 1.2 mg/dL   GFR calc non Af Amer >60 >60 mL/min   GFR calc Af Amer >60 >60 mL/min   Anion gap 10 5 - 15  CBC  Result Value Ref Range   WBC 11.9 (H) 4.0 - 10.5 K/uL   RBC 4.84 4.22 - 5.81 MIL/uL   Hemoglobin 14.6 13.0 -  17.0 g/dL   HCT 16.1 09.6 - 04.5 %   MCV 91.5 78.0 - 100.0 fL   MCH 30.2 26.0 - 34.0 pg   MCHC 33.0 30.0 - 36.0 g/dL   RDW 40.9 81.1 - 91.4 %   Platelets 174 150 - 400 K/uL   Dg Chest 2 View Result Date: 11/20/2017 CLINICAL DATA:  Cough, congestion and shortness of breath. EXAM: CHEST  2 VIEW COMPARISON:  02/14/2016 FINDINGS: Lungs are adequately inflated without focal airspace consolidation or effusion. Flattening of the hemidiaphragms on the lateral film. Cardiomediastinal silhouette and remainder the exam is unchanged. IMPRESSION: No active cardiopulmonary disease. Electronically Signed   By: Elberta Fortis M.D.   On: 11/20/2017 12:19    1535:  Pt given IV solumedrol and hour long neb on arrival. Pt states he "feels better." Ambulated after neb with Sats remaining 97% R/A, though HR now 110-120's. Denies CP. Troponin pending. Sign out to Dr. Estell Harpin.     Final Clinical Impressions(s) / ED Diagnoses   Final diagnoses:  None    ED Discharge Orders    None       Samuel Jester, DO 11/20/17 1535

## 2017-11-20 NOTE — Discharge Instructions (Signed)
Follow-up with your family doctor later this week.  Return if any problems.  Continue using your albuterol every 4-6 hours if needed

## 2017-11-20 NOTE — ED Notes (Signed)
Ambulated pt around nurses station. Pt's O2 remained at 93% during ambulation and pt states he feels wonderful and wants to go home.

## 2017-11-20 NOTE — ED Notes (Signed)
Pt refused wheelchair.  Ambulatory out with no distress.

## 2017-11-20 NOTE — ED Notes (Signed)
Gave Dr. Butler EKG  

## 2017-11-20 NOTE — ED Notes (Signed)
Pt ambulated without difficulty.  Denies shortness of breath.

## 2017-11-20 NOTE — ED Triage Notes (Addendum)
Pt c/o of sob, congestion and productive cough, n/v since Saturday.

## 2017-11-20 NOTE — ED Notes (Signed)
Although pt's sats low, pt denies shortness of breath and is requesting to go home.

## 2018-02-12 ENCOUNTER — Other Ambulatory Visit: Payer: Self-pay

## 2018-02-12 ENCOUNTER — Encounter (HOSPITAL_COMMUNITY): Payer: Self-pay | Admitting: Emergency Medicine

## 2018-02-12 DIAGNOSIS — L509 Urticaria, unspecified: Secondary | ICD-10-CM | POA: Insufficient documentation

## 2018-02-12 DIAGNOSIS — R21 Rash and other nonspecific skin eruption: Secondary | ICD-10-CM | POA: Diagnosis not present

## 2018-02-12 DIAGNOSIS — Z5321 Procedure and treatment not carried out due to patient leaving prior to being seen by health care provider: Secondary | ICD-10-CM | POA: Diagnosis not present

## 2018-02-12 NOTE — ED Triage Notes (Signed)
Pt c/o rash and hives with itching since after lunch today. Pt denies starting any new meds or any other products.

## 2018-02-13 ENCOUNTER — Encounter (HOSPITAL_COMMUNITY): Payer: Self-pay | Admitting: Emergency Medicine

## 2018-02-13 ENCOUNTER — Other Ambulatory Visit: Payer: Self-pay

## 2018-02-13 ENCOUNTER — Emergency Department (HOSPITAL_COMMUNITY)
Admission: EM | Admit: 2018-02-13 | Discharge: 2018-02-13 | Disposition: A | Discharge status: Home / Self Care | Payer: Medicare Other | Source: Home / Self Care | Attending: Emergency Medicine | Admitting: Emergency Medicine

## 2018-02-13 ENCOUNTER — Emergency Department (HOSPITAL_COMMUNITY)
Admission: EM | Admit: 2018-02-13 | Discharge: 2018-02-13 | Disposition: A | Discharge status: Home / Self Care | Payer: Medicare Other | Attending: Emergency Medicine | Admitting: Emergency Medicine

## 2018-02-13 DIAGNOSIS — I10 Essential (primary) hypertension: Secondary | ICD-10-CM | POA: Insufficient documentation

## 2018-02-13 DIAGNOSIS — Z87891 Personal history of nicotine dependence: Secondary | ICD-10-CM

## 2018-02-13 DIAGNOSIS — Z7982 Long term (current) use of aspirin: Secondary | ICD-10-CM | POA: Insufficient documentation

## 2018-02-13 DIAGNOSIS — L509 Urticaria, unspecified: Secondary | ICD-10-CM | POA: Insufficient documentation

## 2018-02-13 DIAGNOSIS — J449 Chronic obstructive pulmonary disease, unspecified: Secondary | ICD-10-CM | POA: Insufficient documentation

## 2018-02-13 DIAGNOSIS — R21 Rash and other nonspecific skin eruption: Secondary | ICD-10-CM | POA: Diagnosis not present

## 2018-02-13 MED ORDER — PREDNISONE 20 MG PO TABS: 40.0000 mg | ORAL_TABLET | Freq: Every day | ORAL | 0 refills | Status: AC

## 2018-02-13 MED ORDER — PREDNISONE 50 MG PO TABS
60.0000 mg | ORAL_TABLET | Freq: Once | ORAL | Status: AC
  Administered 2018-02-13: 60 mg via ORAL
  Filled 2018-02-13: qty 1

## 2018-02-13 MED ORDER — DIPHENHYDRAMINE HCL 50 MG PO TABS: 50.0000 mg | ORAL_TABLET | Freq: Four times a day (QID) | ORAL | 0 refills | Status: AC | PRN

## 2018-02-13 MED ORDER — DIPHENHYDRAMINE HCL 25 MG PO CAPS
50.0000 mg | ORAL_CAPSULE | Freq: Once | ORAL | Status: AC
  Administered 2018-02-13: 50 mg via ORAL
  Filled 2018-02-13: qty 2

## 2018-02-13 NOTE — ED Provider Notes (Signed)
Medical screening examination/treatment/procedure(s) were conducted as a shared visit with non-physician practitioner(s) and myself.  I personally evaluated the patient during the encounter.  None   Patient seen by me along with physician assistant.  Patient with an urticarial rash try to get seen yesterday the weights were too long he was been treating himself with Benadryl.  Never had any shortness of breath tongue swelling or lip swelling.  Not sure what caused it but he was handling a black snake before this did occur so that is a possibility.  Patient never had a rash like this before the rash is very itchy.  Very consistent with urticarial rash is mostly on his  anterior trunk some on his back and and in bilateral upper thighs.  Patient will be treated with 60 mg of prednisone here continued on prednisone 40 mg for 5 days recommend Benadryl every 6 hours for the next 24 hours.  Patient will be given a dose of Benadryl here as well.  Patient nontoxic no acute distress.  Is not having any wheezing has no tongue or lip swelling.   Vanetta Mulders, MD 02/13/18 [phone]

## 2018-02-13 NOTE — ED Notes (Signed)
Follow up call made  No answer  1330  02/13/18 s Mahkayla Preece rn

## 2018-02-13 NOTE — ED Provider Notes (Signed)
[facility] EMERGENCY DEPARTMENT Provider Note   CSN: [id number] Arrival date & time: 02/13/18  1650     History   Chief Complaint Chief Complaint  Patient presents with  . Rash    HPI Greg Palmer is a 67 y.o. male with a hx of COPD, HTN, and hyperlipidemia who presents to the ED for rash which started yesterday afternoon. Patient states that he did catch a black snake yesterday, no bite, just caught it, but otherwise he has not had any new interactions, products, medications, or foods. He states that he develop a generalized rash which he describes as hives that are itchy to trunk and extremities x 4. Took benadryl yesterday with some improvement in pruritus. Rash seems to be worse today. He feels his hands are swollen. Denies dyspnea, feeling of throat closing, wheezing, stridor, or any other concerns.  HPI  Past Medical History:  Diagnosis Date  . Chronic shoulder pain   . COPD (chronic obstructive pulmonary disease) (HCC)   . Hyperlipidemia   . Hypertension     Patient Active Problem List   Diagnosis Date Noted  . Acute respiratory failure with hypoxemia (HCC) 02/15/2016  . Acute bronchitis 02/15/2016  . Elevated brain natriuretic peptide (BNP) level 02/15/2016  . Constipation 02/15/2016  . COPD with exacerbation (HCC) 02/14/2016  . HLD (hyperlipidemia) 02/14/2016  . HTN (hypertension) 02/14/2016  . COPD with acute exacerbation (HCC) 02/14/2016  . COPD exacerbation (HCC) 02/14/2016    Past Surgical History:  Procedure Laterality Date  . CATARACT EXTRACTION W/PHACO Left 08/01/2016   Procedure: CATARACT EXTRACTION PHACO AND INTRAOCULAR LENS PLACEMENT (IOC);  Surgeon: Jethro Bolus, MD;  Location: [facility];  Service: Ophthalmology;  Laterality: Left;  CDE: 29.22  . CATARACT EXTRACTION W/PHACO Right 08/15/2016   Procedure: CATARACT EXTRACTION PHACO AND INTRAOCULAR LENS PLACEMENT (IOC);  Surgeon: Jethro Bolus, MD;  Location: [facility];  Service: Ophthalmology;  Laterality:  Right;  CDE: 8.32        Home Medications    Prior to Admission medications   Medication Sig Start Date End Date Taking? Authorizing Provider  albuterol (PROVENTIL HFA;VENTOLIN HFA) 108 (90 BASE) MCG/ACT inhaler Inhale 2 puffs into the lungs every 4 (four) hours as needed for wheezing. 02/09/13   Cathren Laine, MD  albuterol (PROVENTIL) (2.5 MG/3ML) 0.083% nebulizer solution Inhale 2.5 mg into the lungs every 6 (six) hours as needed for shortness of breath, wheezing or cough. 06/27/16   [provider]  aspirin EC 81 MG tablet Take 81 mg by mouth at bedtime.     [provider]  CINNAMON PO Take 1,000 mg by mouth at bedtime.     [provider]  doxycycline (VIBRAMYCIN) 100 MG capsule Take 1 capsule (100 mg total) by mouth 2 (two) times daily. One po bid x 7 days 11/20/17   Bethann Berkshire, MD  GARLIC PO Take 1 capsule by mouth at bedtime.    [provider]  Multiple Vitamins-Minerals (MULTIVITAMINS THER. W/MINERALS) TABS tablet Take 1 tablet by mouth at bedtime.     [provider]  Omega-3 Fatty Acids (SALMON OIL PO) Take 1 capsule by mouth at bedtime.    [provider]  predniSONE (DELTASONE) 20 MG tablet 2 tabs po daily x 3 days 11/20/17   Bethann Berkshire, MD    Family History History reviewed. No pertinent family history.  Social History Social History   Tobacco Use  . Smoking status: Former Smoker    Packs/day: 0.50  Years: 4.00    Pack years: 2.00    Types: Cigarettes    Last attempt to quit: 07/28/1970    Years since quitting: 47.5  . Smokeless tobacco: Never Used  Substance Use Topics  . Alcohol use: Yes    Alcohol/week: 0.6 oz    Types: 1 Glasses of wine per week    Comment: occasionally  . Drug use: Yes    Types: Marijuana    Comment: 2 weeks ago     Allergies   Cats claw [uncaria tomentosa (cats claw)]   Review of Systems Review of Systems  Constitutional: Negative for fever.  HENT: Negative for  trouble swallowing.   Respiratory: Negative for cough, choking, shortness of breath, wheezing and stridor.   Skin: Positive for rash.     Physical Exam Updated Vital Signs BP (!) 160/89 (BP Location: Right Arm)   Pulse 89   Temp 98.2 F (36.8 C) (Oral)   Resp 19   SpO2 97%   Physical Exam  Constitutional: He appears well-developed and well-nourished. No distress.  HENT:  Head: Normocephalic and atraumatic.  Patient is tolerating his own secretions without difficulty.  Airways patent.  No drooling.  No trismus. No stridor. No notable facial/intra oral swelling.   Eyes: Conjunctivae are normal. Right eye exhibits no discharge. Left eye exhibits no discharge.  Cardiovascular: Normal rate and regular rhythm.  No murmur heard. Pulmonary/Chest: Effort normal and breath sounds normal. No accessory muscle usage or stridor. No apnea. No respiratory distress. He has no wheezes. He has no rhonchi. He has no rales.  Abdominal: Soft. He exhibits no distension. There is no tenderness.  Neurological: He is alert.  Clear speech.   Skin: Skin is warm and dry. Rash (To trunk, most prominently on the abdomen and chest, also present to upper and lower extremities) noted. Rash is urticarial.  Patient's hands do appear swollen.  Psychiatric: He has a normal mood and affect. His behavior is normal.  Nursing note and vitals reviewed.    ED Treatments / Results  Labs (all labs ordered are listed, but only abnormal results are displayed) Labs Reviewed - No data to display  EKG None  Radiology No results found.  Procedures Procedures (including critical care time)  Medications Ordered in ED Medications  predniSONE (DELTASONE) tablet 60 mg (60 mg Oral Given 02/13/18 1946)  diphenhydrAMINE (BENADRYL) capsule 50 mg (50 mg Oral Given 02/13/18 1945)     Initial Impression / Assessment and Plan / ED Course  I have reviewed the triage vital signs and the nursing notes.  Pertinent labs & imaging  results that were available during my care of the patient were reviewed by me and considered in my medical decision making (see chart for details).  Patient presents with urticarial rash.  He does not appear to be in respiratory distress, no stridor, airway is patent, tolerating secretions without difficulty, patient is not complaining that he feels as though his throat is closing.  Recommend continuation of Benadryl- prescription provided.  Will treat with steroid burst. I discussed treatment plan, need for PCP follow-up, and return precautions with the patient and his wife. Provided opportunity for questions, patient and his wife confirmed understanding and are in agreement with plan.   Findings and plan of care discussed with supervising physician Dr. Deretha Emory who personally evaluated and examined this patient and is in agreement with plan.    Final Clinical Impressions(s) / ED Diagnoses   Final diagnoses:  Urticaria  ED Discharge Orders        Ordered    predniSONE (DELTASONE) 20 MG tablet  Daily with breakfast     02/13/18 1946    diphenhydrAMINE (BENADRYL) 50 MG tablet  Every 6 hours PRN     02/13/18 1946       Petrucelli, Pleas Koch, PA-C 02/13/18 2015    Vanetta Mulders, MD 02/14/18 [phone]

## 2018-02-13 NOTE — ED Triage Notes (Signed)
Triaged here last night for rash but left before getting to room. States he took a benadryl last night and felt better. Rash started yesterday. Pt has red welts noted all over body. Denies pain

## 2018-02-13 NOTE — ED Notes (Signed)
Per registration pt left facility with family. 

## 2018-02-13 NOTE — Discharge Instructions (Addendum)
You were seen in the emergency department for a rash that looks consistent with hives.  We are sending you home with a prescription for Benadryl take this every 6 hours for itching and for the rash-do not drive or operate heavy machinery when you take this medication as it can cause you to be sleepy.  We are also sending you home with prednisone this is a steroid to also help with the rash, take this daily for the next 5 days.  We have prescribed you new medication(s) today. Discuss the medications prescribed today with your pharmacist as they can have adverse effects and interactions with your other medicines including over the counter and prescribed medications. Seek medical evaluation if you start to experience new or abnormal symptoms after taking one of these medicines, seek care immediately if you start to experience difficulty breathing, feeling of your throat closing, facial swelling, or rash as these could be indications of a more serious allergic reaction   Follow-up with your primary care provider in 3 days for reevaluation.  Return to ER for new or worsening symptoms including but not limited to difficulty breathing, feeling of your throat closing, abnormal breathing sounds, worsening of the rash, or any other concerns.

## 2018-07-31 DIAGNOSIS — Z23 Encounter for immunization: Secondary | ICD-10-CM | POA: Diagnosis not present

## 2018-09-09 DIAGNOSIS — E7849 Other hyperlipidemia: Secondary | ICD-10-CM | POA: Diagnosis not present

## 2018-09-09 DIAGNOSIS — E785 Hyperlipidemia, unspecified: Secondary | ICD-10-CM | POA: Diagnosis not present

## 2018-09-09 DIAGNOSIS — Z23 Encounter for immunization: Secondary | ICD-10-CM | POA: Diagnosis not present

## 2018-09-09 DIAGNOSIS — Z681 Body mass index (BMI) 19 or less, adult: Secondary | ICD-10-CM | POA: Diagnosis not present

## 2018-09-09 DIAGNOSIS — Z0001 Encounter for general adult medical examination with abnormal findings: Secondary | ICD-10-CM | POA: Diagnosis not present

## 2018-09-09 DIAGNOSIS — R7309 Other abnormal glucose: Secondary | ICD-10-CM | POA: Diagnosis not present

## 2018-09-09 DIAGNOSIS — Z125 Encounter for screening for malignant neoplasm of prostate: Secondary | ICD-10-CM | POA: Diagnosis not present

## 2018-09-09 DIAGNOSIS — Z1389 Encounter for screening for other disorder: Secondary | ICD-10-CM | POA: Diagnosis not present

## 2018-09-09 DIAGNOSIS — J449 Chronic obstructive pulmonary disease, unspecified: Secondary | ICD-10-CM | POA: Diagnosis not present

## 2019-06-25 DIAGNOSIS — Z23 Encounter for immunization: Secondary | ICD-10-CM | POA: Diagnosis not present

## 2019-07-21 IMAGING — DX DG CHEST 2V
2 series · 2 of 2 positions shown · non-contrast
Comparison: 02/14/2016

CLINICAL DATA: Cough, congestion and shortness of breath.

EXAM:
CHEST  2 VIEW

[chest pa]
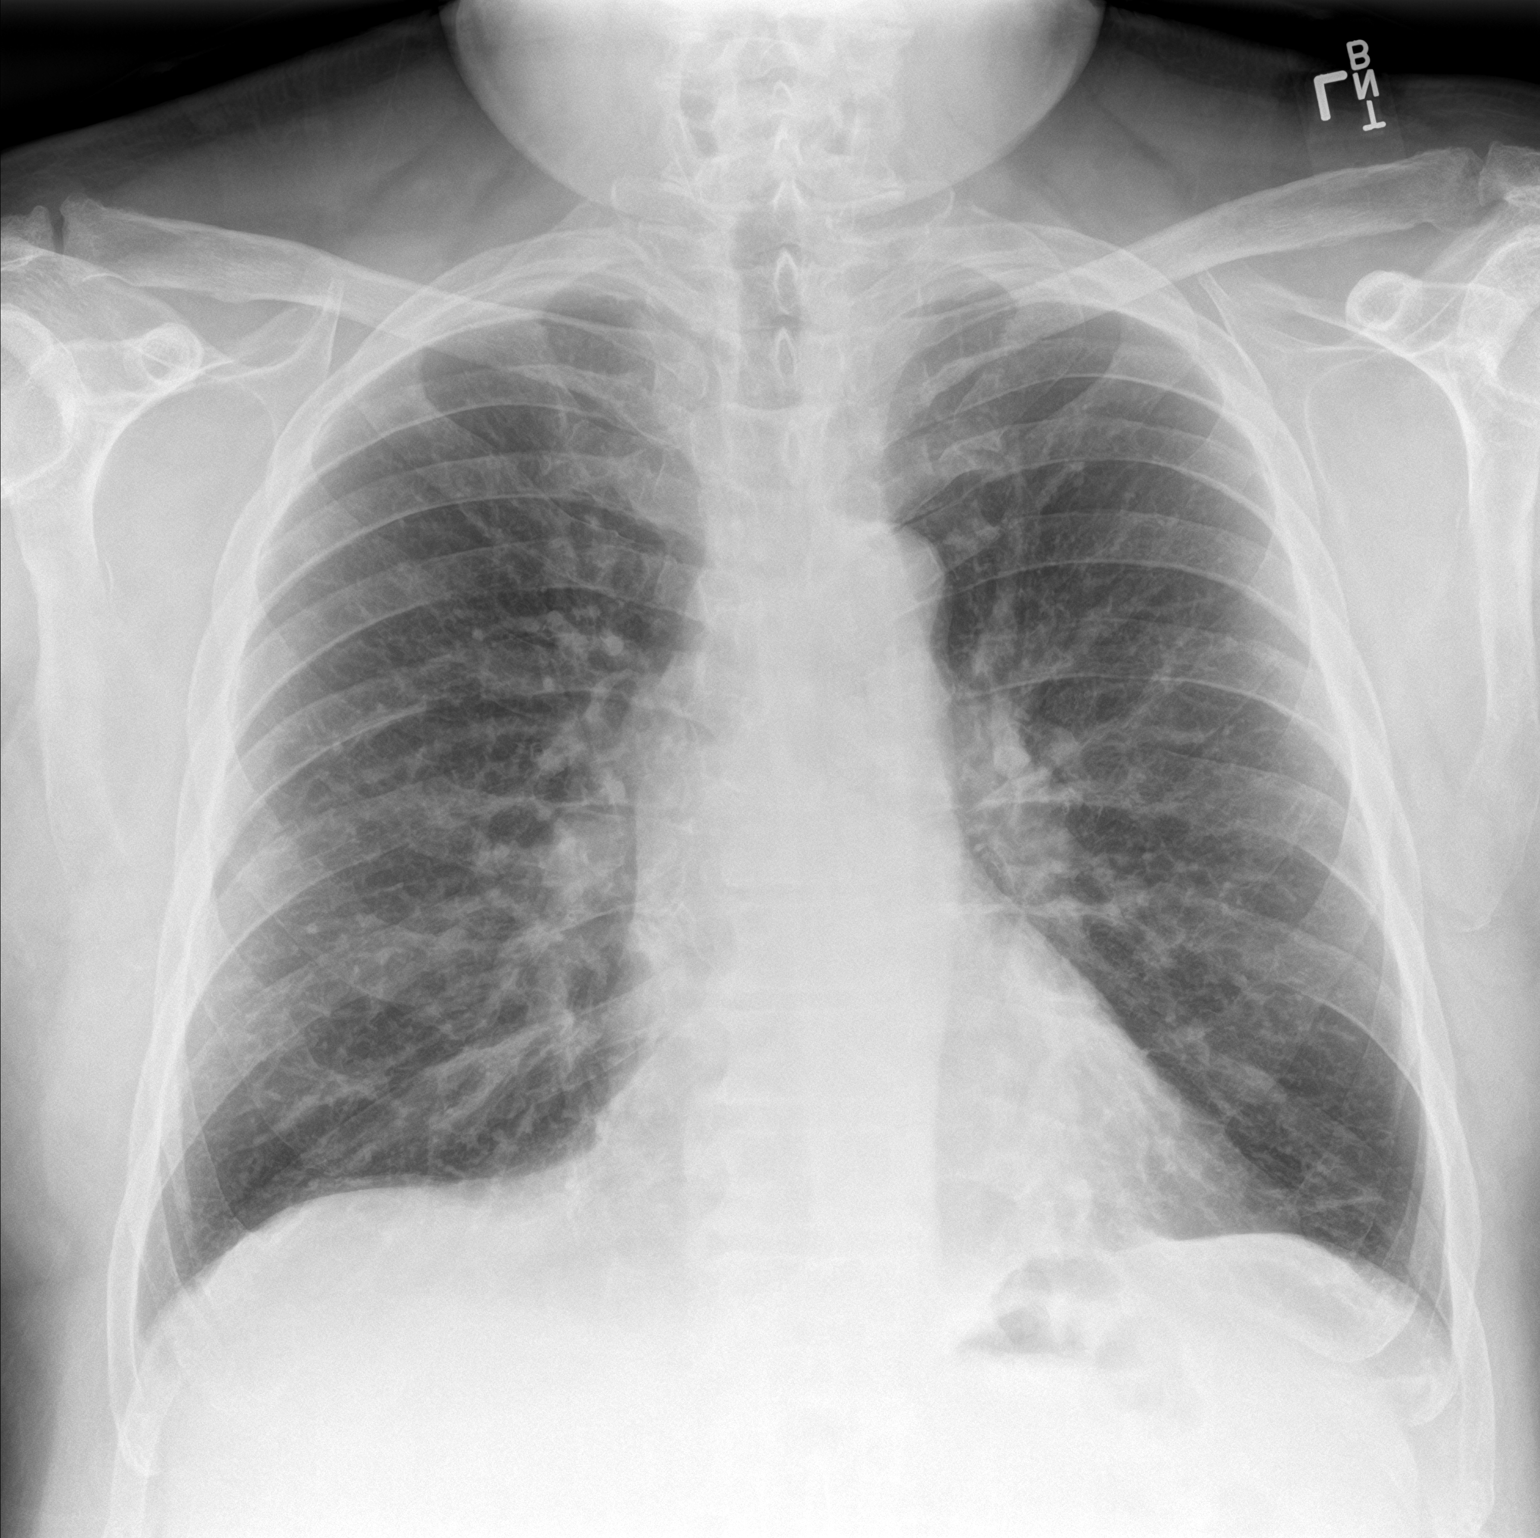

[chest lat]
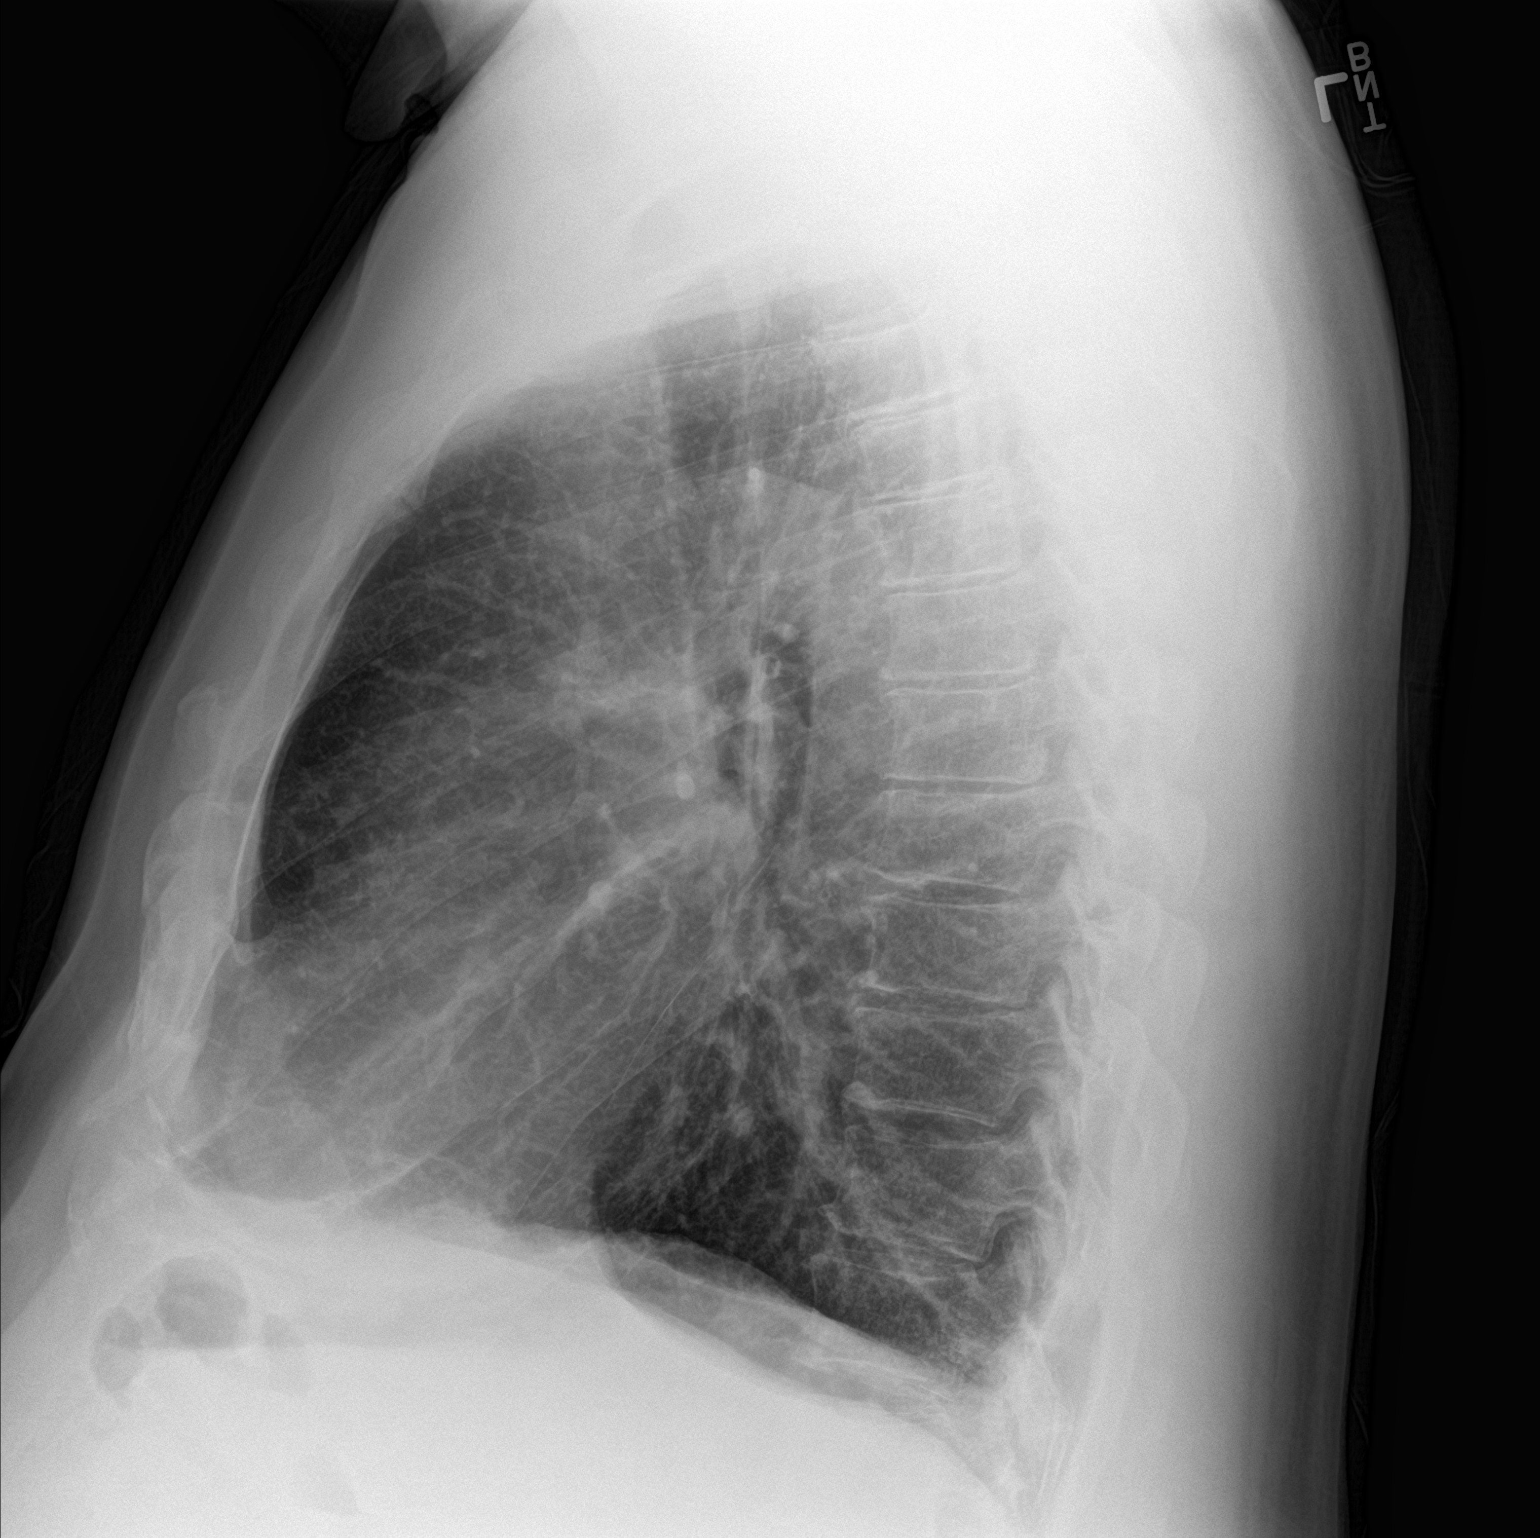

[2 of 2 positions shown; findings below may reference images not displayed]

FINDINGS: Lungs are adequately inflated without focal airspace consolidation
or effusion. Flattening of the hemidiaphragms on the lateral film.
Cardiomediastinal silhouette and remainder the exam is unchanged.
IMPRESSION: No active cardiopulmonary disease.

## 2019-09-25 DIAGNOSIS — E782 Mixed hyperlipidemia: Secondary | ICD-10-CM | POA: Diagnosis not present

## 2019-09-25 DIAGNOSIS — R7309 Other abnormal glucose: Secondary | ICD-10-CM | POA: Diagnosis not present

## 2019-09-25 DIAGNOSIS — Z1389 Encounter for screening for other disorder: Secondary | ICD-10-CM | POA: Diagnosis not present

## 2019-09-25 DIAGNOSIS — Z0000 Encounter for general adult medical examination without abnormal findings: Secondary | ICD-10-CM | POA: Diagnosis not present

## 2019-09-25 DIAGNOSIS — L409 Psoriasis, unspecified: Secondary | ICD-10-CM | POA: Diagnosis not present

## 2019-09-25 DIAGNOSIS — J449 Chronic obstructive pulmonary disease, unspecified: Secondary | ICD-10-CM | POA: Diagnosis not present

## 2019-09-25 DIAGNOSIS — Z0001 Encounter for general adult medical examination with abnormal findings: Secondary | ICD-10-CM | POA: Diagnosis not present

## 2019-09-25 DIAGNOSIS — Z6837 Body mass index (BMI) 37.0-37.9, adult: Secondary | ICD-10-CM | POA: Diagnosis not present

## 2019-10-19 DIAGNOSIS — I10 Essential (primary) hypertension: Secondary | ICD-10-CM | POA: Diagnosis not present

## 2019-10-19 DIAGNOSIS — J449 Chronic obstructive pulmonary disease, unspecified: Secondary | ICD-10-CM | POA: Diagnosis not present

## 2019-10-19 DIAGNOSIS — E7849 Other hyperlipidemia: Secondary | ICD-10-CM | POA: Diagnosis not present

## 2019-11-16 DIAGNOSIS — J449 Chronic obstructive pulmonary disease, unspecified: Secondary | ICD-10-CM | POA: Diagnosis not present

## 2019-11-16 DIAGNOSIS — E7849 Other hyperlipidemia: Secondary | ICD-10-CM | POA: Diagnosis not present

## 2019-11-16 DIAGNOSIS — I10 Essential (primary) hypertension: Secondary | ICD-10-CM | POA: Diagnosis not present

## 2019-12-18 DIAGNOSIS — Z23 Encounter for immunization: Secondary | ICD-10-CM | POA: Diagnosis not present

## 2020-01-09 DIAGNOSIS — Z23 Encounter for immunization: Secondary | ICD-10-CM | POA: Diagnosis not present

## 2020-02-16 DIAGNOSIS — J449 Chronic obstructive pulmonary disease, unspecified: Secondary | ICD-10-CM | POA: Diagnosis not present

## 2020-02-16 DIAGNOSIS — E7849 Other hyperlipidemia: Secondary | ICD-10-CM | POA: Diagnosis not present

## 2020-02-16 DIAGNOSIS — I10 Essential (primary) hypertension: Secondary | ICD-10-CM | POA: Diagnosis not present

## 2020-03-17 DIAGNOSIS — J449 Chronic obstructive pulmonary disease, unspecified: Secondary | ICD-10-CM | POA: Diagnosis not present

## 2020-03-17 DIAGNOSIS — I10 Essential (primary) hypertension: Secondary | ICD-10-CM | POA: Diagnosis not present

## 2020-03-17 DIAGNOSIS — E7849 Other hyperlipidemia: Secondary | ICD-10-CM | POA: Diagnosis not present

## 2020-05-18 DIAGNOSIS — J449 Chronic obstructive pulmonary disease, unspecified: Secondary | ICD-10-CM | POA: Diagnosis not present

## 2020-05-18 DIAGNOSIS — I10 Essential (primary) hypertension: Secondary | ICD-10-CM | POA: Diagnosis not present

## 2020-05-18 DIAGNOSIS — E7849 Other hyperlipidemia: Secondary | ICD-10-CM | POA: Diagnosis not present

## 2020-07-17 DIAGNOSIS — I10 Essential (primary) hypertension: Secondary | ICD-10-CM | POA: Diagnosis not present

## 2020-07-17 DIAGNOSIS — J449 Chronic obstructive pulmonary disease, unspecified: Secondary | ICD-10-CM | POA: Diagnosis not present

## 2020-07-17 DIAGNOSIS — E7849 Other hyperlipidemia: Secondary | ICD-10-CM | POA: Diagnosis not present

## 2020-07-19 DIAGNOSIS — Z23 Encounter for immunization: Secondary | ICD-10-CM | POA: Diagnosis not present

## 2020-09-17 DIAGNOSIS — E7849 Other hyperlipidemia: Secondary | ICD-10-CM | POA: Diagnosis not present

## 2020-09-17 DIAGNOSIS — I10 Essential (primary) hypertension: Secondary | ICD-10-CM | POA: Diagnosis not present

## 2020-09-17 DIAGNOSIS — J449 Chronic obstructive pulmonary disease, unspecified: Secondary | ICD-10-CM | POA: Diagnosis not present

## 2020-10-15 DIAGNOSIS — H9193 Unspecified hearing loss, bilateral: Secondary | ICD-10-CM | POA: Diagnosis not present

## 2020-10-15 DIAGNOSIS — E7849 Other hyperlipidemia: Secondary | ICD-10-CM | POA: Diagnosis not present

## 2020-10-15 DIAGNOSIS — Z6836 Body mass index (BMI) 36.0-36.9, adult: Secondary | ICD-10-CM | POA: Diagnosis not present

## 2020-10-15 DIAGNOSIS — Z0000 Encounter for general adult medical examination without abnormal findings: Secondary | ICD-10-CM | POA: Diagnosis not present

## 2020-10-15 DIAGNOSIS — H9313 Tinnitus, bilateral: Secondary | ICD-10-CM | POA: Diagnosis not present

## 2021-02-18 DIAGNOSIS — Z23 Encounter for immunization: Secondary | ICD-10-CM | POA: Diagnosis not present

## 2021-03-15 DIAGNOSIS — J069 Acute upper respiratory infection, unspecified: Secondary | ICD-10-CM | POA: Diagnosis not present

## 2021-03-15 DIAGNOSIS — J449 Chronic obstructive pulmonary disease, unspecified: Secondary | ICD-10-CM | POA: Diagnosis not present

## 2021-03-15 DIAGNOSIS — Z6837 Body mass index (BMI) 37.0-37.9, adult: Secondary | ICD-10-CM | POA: Diagnosis not present

## 2021-03-16 ENCOUNTER — Encounter (INDEPENDENT_AMBULATORY_CARE_PROVIDER_SITE_OTHER): Payer: Self-pay | Admitting: *Deleted

## 2021-03-17 DIAGNOSIS — J449 Chronic obstructive pulmonary disease, unspecified: Secondary | ICD-10-CM | POA: Diagnosis not present

## 2021-03-17 DIAGNOSIS — I10 Essential (primary) hypertension: Secondary | ICD-10-CM | POA: Diagnosis not present

## 2021-03-17 DIAGNOSIS — E782 Mixed hyperlipidemia: Secondary | ICD-10-CM | POA: Diagnosis not present

## 2021-08-17 DIAGNOSIS — E6609 Other obesity due to excess calories: Secondary | ICD-10-CM | POA: Diagnosis not present

## 2021-08-17 DIAGNOSIS — Z6837 Body mass index (BMI) 37.0-37.9, adult: Secondary | ICD-10-CM | POA: Diagnosis not present

## 2021-08-17 DIAGNOSIS — J22 Unspecified acute lower respiratory infection: Secondary | ICD-10-CM | POA: Diagnosis not present

## 2021-08-17 DIAGNOSIS — J449 Chronic obstructive pulmonary disease, unspecified: Secondary | ICD-10-CM | POA: Diagnosis not present

## 2022-03-17 ENCOUNTER — Other Ambulatory Visit (HOSPITAL_COMMUNITY): Payer: Self-pay | Admitting: Internal Medicine

## 2022-03-17 DIAGNOSIS — M549 Dorsalgia, unspecified: Secondary | ICD-10-CM

## 2022-11-01 DIAGNOSIS — M1990 Unspecified osteoarthritis, unspecified site: Secondary | ICD-10-CM | POA: Diagnosis not present

## 2022-11-01 DIAGNOSIS — I10 Essential (primary) hypertension: Secondary | ICD-10-CM | POA: Diagnosis not present

## 2022-11-01 DIAGNOSIS — M47816 Spondylosis without myelopathy or radiculopathy, lumbar region: Secondary | ICD-10-CM | POA: Diagnosis not present

## 2022-11-01 DIAGNOSIS — Z6835 Body mass index (BMI) 35.0-35.9, adult: Secondary | ICD-10-CM | POA: Diagnosis not present

## 2022-11-01 DIAGNOSIS — J449 Chronic obstructive pulmonary disease, unspecified: Secondary | ICD-10-CM | POA: Diagnosis not present

## 2022-11-01 DIAGNOSIS — Z1331 Encounter for screening for depression: Secondary | ICD-10-CM | POA: Diagnosis not present

## 2022-11-01 DIAGNOSIS — Z0001 Encounter for general adult medical examination with abnormal findings: Secondary | ICD-10-CM | POA: Diagnosis not present

## 2022-11-01 DIAGNOSIS — E6609 Other obesity due to excess calories: Secondary | ICD-10-CM | POA: Diagnosis not present

## 2022-11-01 DIAGNOSIS — E1165 Type 2 diabetes mellitus with hyperglycemia: Secondary | ICD-10-CM | POA: Diagnosis not present

## 2023-06-14 DIAGNOSIS — M47816 Spondylosis without myelopathy or radiculopathy, lumbar region: Secondary | ICD-10-CM | POA: Diagnosis not present

## 2023-06-14 DIAGNOSIS — Z23 Encounter for immunization: Secondary | ICD-10-CM | POA: Diagnosis not present

## 2023-06-14 DIAGNOSIS — E6609 Other obesity due to excess calories: Secondary | ICD-10-CM | POA: Diagnosis not present

## 2023-06-14 DIAGNOSIS — E1165 Type 2 diabetes mellitus with hyperglycemia: Secondary | ICD-10-CM | POA: Diagnosis not present

## 2023-06-14 DIAGNOSIS — Z6834 Body mass index (BMI) 34.0-34.9, adult: Secondary | ICD-10-CM | POA: Diagnosis not present

## 2023-06-14 DIAGNOSIS — I10 Essential (primary) hypertension: Secondary | ICD-10-CM | POA: Diagnosis not present

## 2023-06-14 DIAGNOSIS — J449 Chronic obstructive pulmonary disease, unspecified: Secondary | ICD-10-CM | POA: Diagnosis not present

## 2023-11-05 DIAGNOSIS — E039 Hypothyroidism, unspecified: Secondary | ICD-10-CM | POA: Diagnosis not present

## 2023-11-05 DIAGNOSIS — Z6837 Body mass index (BMI) 37.0-37.9, adult: Secondary | ICD-10-CM | POA: Diagnosis not present

## 2023-11-05 DIAGNOSIS — E1165 Type 2 diabetes mellitus with hyperglycemia: Secondary | ICD-10-CM | POA: Diagnosis not present

## 2023-11-05 DIAGNOSIS — Z125 Encounter for screening for malignant neoplasm of prostate: Secondary | ICD-10-CM | POA: Diagnosis not present

## 2023-11-05 DIAGNOSIS — M47816 Spondylosis without myelopathy or radiculopathy, lumbar region: Secondary | ICD-10-CM | POA: Diagnosis not present

## 2023-11-05 DIAGNOSIS — Z0001 Encounter for general adult medical examination with abnormal findings: Secondary | ICD-10-CM | POA: Diagnosis not present

## 2023-11-05 DIAGNOSIS — Z1331 Encounter for screening for depression: Secondary | ICD-10-CM | POA: Diagnosis not present

## 2023-11-05 DIAGNOSIS — J449 Chronic obstructive pulmonary disease, unspecified: Secondary | ICD-10-CM | POA: Diagnosis not present

## 2023-11-05 DIAGNOSIS — E6609 Other obesity due to excess calories: Secondary | ICD-10-CM | POA: Diagnosis not present

## 2023-11-05 DIAGNOSIS — M1990 Unspecified osteoarthritis, unspecified site: Secondary | ICD-10-CM | POA: Diagnosis not present

## 2023-11-05 DIAGNOSIS — M549 Dorsalgia, unspecified: Secondary | ICD-10-CM | POA: Diagnosis not present

## 2023-11-05 DIAGNOSIS — I10 Essential (primary) hypertension: Secondary | ICD-10-CM | POA: Diagnosis not present

## 2024-01-16 DIAGNOSIS — J449 Chronic obstructive pulmonary disease, unspecified: Secondary | ICD-10-CM | POA: Diagnosis not present

## 2024-01-16 DIAGNOSIS — E1165 Type 2 diabetes mellitus with hyperglycemia: Secondary | ICD-10-CM | POA: Diagnosis not present

## 2024-01-16 DIAGNOSIS — I10 Essential (primary) hypertension: Secondary | ICD-10-CM | POA: Diagnosis not present

## 2024-03-25 ENCOUNTER — Other Ambulatory Visit (HOSPITAL_COMMUNITY): Payer: Self-pay | Admitting: Internal Medicine

## 2024-03-25 ENCOUNTER — Ambulatory Visit (HOSPITAL_COMMUNITY)
Admission: RE | Admit: 2024-03-25 | Discharge: 2024-03-25 | Disposition: A | Discharge status: Home / Self Care | Source: Ambulatory Visit | Attending: Internal Medicine | Admitting: Internal Medicine

## 2024-03-25 DIAGNOSIS — I10 Essential (primary) hypertension: Secondary | ICD-10-CM | POA: Diagnosis not present

## 2024-03-25 DIAGNOSIS — M1990 Unspecified osteoarthritis, unspecified site: Secondary | ICD-10-CM | POA: Diagnosis not present

## 2024-03-25 DIAGNOSIS — Z6837 Body mass index (BMI) 37.0-37.9, adult: Secondary | ICD-10-CM | POA: Diagnosis not present

## 2024-03-25 DIAGNOSIS — L03119 Cellulitis of unspecified part of limb: Secondary | ICD-10-CM | POA: Diagnosis not present

## 2024-03-25 DIAGNOSIS — E1165 Type 2 diabetes mellitus with hyperglycemia: Secondary | ICD-10-CM | POA: Diagnosis not present

## 2024-03-25 DIAGNOSIS — J449 Chronic obstructive pulmonary disease, unspecified: Secondary | ICD-10-CM | POA: Diagnosis not present

## 2024-03-25 DIAGNOSIS — R0600 Dyspnea, unspecified: Secondary | ICD-10-CM

## 2024-03-25 DIAGNOSIS — R609 Edema, unspecified: Secondary | ICD-10-CM | POA: Diagnosis not present

## 2024-03-25 DIAGNOSIS — M47816 Spondylosis without myelopathy or radiculopathy, lumbar region: Secondary | ICD-10-CM | POA: Diagnosis not present

## 2024-08-12 ENCOUNTER — Ambulatory Visit: Admitting: Internal Medicine

## 2024-08-28 ENCOUNTER — Ambulatory Visit: Attending: Internal Medicine | Admitting: Internal Medicine

## 2024-08-28 VITALS — BP 142/99 | HR 76 | Ht 71.0 in | Wt 279.4 lb

## 2024-08-28 DIAGNOSIS — R0602 Shortness of breath: Secondary | ICD-10-CM

## 2024-08-28 DIAGNOSIS — J209 Acute bronchitis, unspecified: Secondary | ICD-10-CM

## 2024-08-28 DIAGNOSIS — R0609 Other forms of dyspnea: Secondary | ICD-10-CM | POA: Diagnosis not present

## 2024-08-28 DIAGNOSIS — E785 Hyperlipidemia, unspecified: Secondary | ICD-10-CM

## 2024-08-28 DIAGNOSIS — J441 Chronic obstructive pulmonary disease with (acute) exacerbation: Secondary | ICD-10-CM | POA: Diagnosis not present

## 2024-08-28 DIAGNOSIS — I10 Essential (primary) hypertension: Secondary | ICD-10-CM | POA: Diagnosis not present

## 2024-08-28 DIAGNOSIS — I491 Atrial premature depolarization: Secondary | ICD-10-CM | POA: Diagnosis not present

## 2024-08-28 DIAGNOSIS — I509 Heart failure, unspecified: Secondary | ICD-10-CM | POA: Diagnosis not present

## 2024-08-28 DIAGNOSIS — J9601 Acute respiratory failure with hypoxia: Secondary | ICD-10-CM

## 2024-08-28 MED ORDER — FUROSEMIDE 40 MG PO TABS: 40.0000 mg | ORAL_TABLET | Freq: Every day | ORAL | 5 refills | Status: DC | PRN

## 2024-08-28 NOTE — Progress Notes (Unsigned)
 Cardiology Office Note  Date: 08/28/2024   ID: Cejay Cambre, DOB 12/26/1950, MRN [id number]  PCP:  Bertell Satterfield, MD  Cardiologist:  None Electrophysiologist:  None   History of Present Illness: Greg Palmer is a 73 y.o. male  Past Medical History:  Diagnosis Date   Chronic shoulder pain    COPD (chronic obstructive pulmonary disease) (HCC)    Hyperlipidemia    Hypertension     Past Surgical History:  Procedure Laterality Date   CATARACT EXTRACTION W/PHACO Left 08/01/2016   Procedure: CATARACT EXTRACTION PHACO AND INTRAOCULAR LENS PLACEMENT (IOC);  Surgeon: Oneil Platts, MD;  Location: [facility];  Service: Ophthalmology;  Laterality: Left;  CDE: 29.22   CATARACT EXTRACTION W/PHACO Right 08/15/2016   Procedure: CATARACT EXTRACTION PHACO AND INTRAOCULAR LENS PLACEMENT (IOC);  Surgeon: Oneil Platts, MD;  Location: [facility];  Service: Ophthalmology;  Laterality: Right;  CDE: 8.32    Current Outpatient Medications  Medication Sig Dispense Refill   albuterol  (PROVENTIL  HFA;VENTOLIN  HFA) 108 (90 BASE) MCG/ACT inhaler Inhale 2 puffs into the lungs every 4 (four) hours as needed for wheezing. 1 Inhaler 1   albuterol  (PROVENTIL ) (2.5 MG/3ML) 0.083% nebulizer solution Inhale 2.5 mg into the lungs every 6 (six) hours as needed for shortness of breath, wheezing or cough.  11   aspirin  EC 81 MG tablet Take 81 mg by mouth at bedtime.      CINNAMON PO Take 1,000 mg by mouth at bedtime.      diphenhydrAMINE  (BENADRYL ) 50 MG tablet Take 1 tablet (50 mg total) by mouth every 6 (six) hours as needed for itching. 30 tablet 0   doxycycline  (VIBRAMYCIN ) 100 MG capsule Take 1 capsule (100 mg total) by mouth 2 (two) times daily. One po bid x 7 days 14 capsule 0   GARLIC PO Take 1 capsule by mouth at bedtime.     Multiple Vitamins-Minerals (MULTIVITAMINS THER. W/MINERALS) TABS tablet Take 1 tablet by mouth at bedtime.      Omega-3 Fatty Acids (SALMON OIL PO) Take 1 capsule by mouth at bedtime.      predniSONE  (DELTASONE ) 20 MG tablet Take 2 tablets (40 mg total) by mouth daily with breakfast. 10 tablet 0   No current facility-administered medications for this visit.   Allergies:  Cats claw [uncaria tomentosa (cats claw)]   Social History: The patient  reports that he quit smoking about 54 years ago. His smoking use included cigarettes. He started smoking about 58 years ago. He has a 2 pack-year smoking history. He has never used smokeless tobacco. He reports current alcohol use of about 1.0 standard drink of alcohol per week. He reports current drug use. Drug: Marijuana.   Family History: The patient's family history includes CVA in his paternal grandfather; Coronary artery disease in his father; Diabetes in his brother.   ROS:  Please see the history of present illness. Otherwise, complete review of systems is positive for none  All other systems are reviewed and negative.   Physical Exam: VS:  There were no vitals taken for this visit., BMI There is no height or weight on file to calculate BMI.  Wt Readings from Last 3 Encounters:  02/12/18 268 lb (121.6 kg)  11/20/17 258 lb (117 kg)  02/25/17 268 lb (121.6 kg)    General: Patient appears comfortable at rest. HEENT: Conjunctiva and lids normal, oropharynx clear with moist mucosa. Neck: Supple, no elevated JVP or carotid bruits, no thyromegaly. Lungs: Clear to auscultation, nonlabored breathing at  rest. Cardiac: Regular rate and rhythm, no S3 or significant systolic murmur, no pericardial rub. Abdomen: Soft, nontender, no hepatomegaly, bowel sounds present, no guarding or rebound. Extremities: No pitting edema, distal pulses 2+. Skin: Warm and dry. Musculoskeletal: No kyphosis. Neuropsychiatric: Alert and oriented x3, affect grossly appropriate.  Recent Labwork: No results found for requested labs within last 365 days.  No results found for: CHOL, TRIG, HDL, CHOLHDL, VLDL, LDLCALC, LDLDIRECT  Other Studies  Reviewed Today:   Assessment and Plan:         Medication Adjustments/Labs and Tests Ordered: Current medicines are reviewed at length with the patient today.  Concerns regarding medicines are outlined above.    Disposition:  Follow up {follow up:15908}  Signed Eland Lamantia Priya Tag Wurtz, MD, 08/28/2024 3:56 PM    [facility] at [facility] [address] [place], [place], [place] [address]

## 2024-08-28 NOTE — Patient Instructions (Signed)
 Medication Instructions:  Your physician has recommended you make the following change in your medication:  Start Lasix 40 mg once daily as needed for shortness of breath and leg swelling Continue taking all other medications as prescribed   Labwork: BNP to be completed on tomorrow at [facility]   Testing/Procedures: Your physician has requested that you have an echocardiogram. Echocardiography is a painless test that uses sound waves to create images of your heart. It provides your doctor with information about the size and shape of your heart and how well your hearts chambers and valves are working. This procedure takes approximately one hour. There are no restrictions for this procedure. Please do NOT wear cologne, perfume, aftershave, or lotions (deodorant is allowed). Please arrive 15 minutes prior to your appointment time.  Please note: We ask at that you not bring children with you during ultrasound (echo/ vascular) testing. Due to room size and safety concerns, children are not allowed in the ultrasound rooms during exams. Our front office staff cannot provide observation of children in our lobby area while testing is being conducted. An adult accompanying a patient to their appointment will only be allowed in the ultrasound room at the discretion of the ultrasound technician under special circumstances. We apologize for any inconvenience.   Follow-Up: Your physician recommends that you schedule a follow-up appointment in: 3 months  Any Other Special Instructions Will Be Listed Below (If Applicable). Thank you for choosing [facility]!     If you need a refill on your cardiac medications before your next appointment, please call your pharmacy.

## 2024-08-29 DIAGNOSIS — I491 Atrial premature depolarization: Secondary | ICD-10-CM | POA: Insufficient documentation

## 2024-08-29 DIAGNOSIS — R0609 Other forms of dyspnea: Secondary | ICD-10-CM | POA: Insufficient documentation

## 2024-08-29 DIAGNOSIS — I509 Heart failure, unspecified: Secondary | ICD-10-CM | POA: Insufficient documentation

## 2024-08-30 LAB — BRAIN NATRIURETIC PEPTIDE: BNP: 191.5 pg/mL — ABNORMAL HIGH (ref 0.0–100.0)

## 2024-09-03 ENCOUNTER — Ambulatory Visit: Payer: Self-pay | Admitting: Internal Medicine

## 2024-09-15 ENCOUNTER — Ambulatory Visit: Attending: Internal Medicine

## 2024-09-15 DIAGNOSIS — I509 Heart failure, unspecified: Secondary | ICD-10-CM

## 2024-09-15 LAB — ECHOCARDIOGRAM COMPLETE
AR max vel: 1.49 cm2
AV Area VTI: 1.47 cm2
AV Area mean vel: 1.4 cm2
AV Mean grad: 17 mmHg
AV Peak grad: 27.2 mmHg
Ao pk vel: 2.61 m/s
Area-P 1/2: 3.74 cm2
Calc EF: 66.7 %
MV VTI: 2.36 cm2
S' Lateral: 4 cm
Single Plane A2C EF: 71 %
Single Plane A4C EF: 59.6 %

## 2024-09-16 MED ORDER — FUROSEMIDE 20 MG PO TABS: 20.0000 mg | ORAL_TABLET | Freq: Every day | ORAL | 3 refills | Status: AC

## 2024-09-16 NOTE — Telephone Encounter (Signed)
 The patient has been notified of the result and verbalized understanding.  All questions (if any) were answered. Bernett Dorothyann LABOR, RN 09/16/2024 9:46 AM

## 2024-09-16 NOTE — Telephone Encounter (Signed)
-----   Message from Vishnu Mallipeddi, MD sent at 09/03/2024  4:39 PM EST ----- BNP mildly elevated.  Currently on p.o. Lasix  40 mg as needed.  Switch to p.o. Lasix  20 mg once daily.

## 2024-10-01 ENCOUNTER — Encounter (INDEPENDENT_AMBULATORY_CARE_PROVIDER_SITE_OTHER): Payer: Self-pay | Admitting: *Deleted
